# Patient Record
Sex: Male | Born: 1995 | Race: White | Hispanic: No | Marital: Single | Smoking: Current some day smoker
Health system: Southern US, Community
[De-identification: ages and names within clinical notes are randomized; demographics above are authoritative.]

## PROBLEM LIST (undated history)

## (undated) DIAGNOSIS — F419 Anxiety disorder, unspecified: Secondary | ICD-10-CM

## (undated) DIAGNOSIS — F909 Attention-deficit hyperactivity disorder, unspecified type: Secondary | ICD-10-CM

## (undated) DIAGNOSIS — K219 Gastro-esophageal reflux disease without esophagitis: Secondary | ICD-10-CM

## (undated) HISTORY — DX: Attention-deficit hyperactivity disorder, unspecified type: F90.9

---
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
## 2014-03-19 ENCOUNTER — Institutional Professional Consult (permissible substitution): Payer: No Typology Code available for payment source | Admitting: Pediatrics

## 2014-03-31 ENCOUNTER — Institutional Professional Consult (permissible substitution) (INDEPENDENT_AMBULATORY_CARE_PROVIDER_SITE_OTHER): Payer: No Typology Code available for payment source | Admitting: Pediatrics

## 2014-03-31 DIAGNOSIS — F909 Attention-deficit hyperactivity disorder, unspecified type: Secondary | ICD-10-CM

## 2014-05-07 ENCOUNTER — Encounter: Payer: Self-pay | Admitting: Family Medicine

## 2014-05-07 ENCOUNTER — Ambulatory Visit (INDEPENDENT_AMBULATORY_CARE_PROVIDER_SITE_OTHER): Payer: No Typology Code available for payment source | Admitting: Family Medicine

## 2014-05-07 ENCOUNTER — Telehealth: Payer: Self-pay | Admitting: Family Medicine

## 2014-05-07 VITALS — BP 127/81 | HR 94 | Temp 97.6°F | Ht 72.0 in | Wt 256.0 lb

## 2014-05-07 DIAGNOSIS — IMO0002 Reserved for concepts with insufficient information to code with codable children: Secondary | ICD-10-CM

## 2014-05-07 DIAGNOSIS — S39012A Strain of muscle, fascia and tendon of lower back, initial encounter: Secondary | ICD-10-CM

## 2014-05-07 MED ORDER — CYCLOBENZAPRINE HCL 5 MG PO TABS: 5.0000 mg | ORAL_TABLET | Freq: Three times a day (TID) | ORAL | Status: DC | PRN

## 2014-05-07 NOTE — Patient Instructions (Signed)
You are likely suffering from upper back strain and spasm Please start regular exercises and heat and massage.  Please try the flexeril at night when you aren't working or driving Please start taking advil 600-800 mg every 6-8 hours for the next 7-14 days Come back in 2 weeks if you aren't any better.  Mid-Back Strain with Rehab  A strain is an injury in which a tendon or muscle is torn. The muscles and tendons of the mid-back are vulnerable to strains. However, these muscles and tendons are very strong and require a great force to be injured. The muscles of the mid-back are responsible for stabilizing the spinal column, as well as spinal twisting (rotation). Strains are classified into three categories. Grade 1 strains cause pain, but the tendon is not lengthened. Grade 2 strains include a lengthened ligament, due to the ligament being stretched or partially ruptured. With grade 2 strains there is still function, although the function may be decreased. Grade 3 strains involve a complete tear of the tendon or muscle, and function is usually impaired. SYMPTOMS   Pain in the middle of the back.  Pain that may affect only one side, and is worse with movement.  Muscle spasms, and often swelling in the back.  Loss of strength of the back muscles.  Crackling sound (crepitation) when the muscles are touched. CAUSES  Mid-back strains occur when a force is placed on the muscles or tendons that is greater than they can handle. Common causes of injury include:  Ongoing overuse of the muscle-tendon units in the middle back, usually from incorrect body posture.  A single violent injury or force applied to the back. RISK INCREASES WITH:  Sports that involve twisting forces on the spine or a lot of bending at the waist (football, rugby, weightlifting, bowling, golf, tennis, speed skating, racquetball, swimming, running, gymnastics, diving).  Poor strength and flexibility.  Failure to warm up properly  before activity.  Family history of low back pain or disk disorders.  Previous back injury or surgery (especially fusion). PREVENTION  Learn and use proper sports technique.  Warm up and stretch properly before activity.  Allow for adequate recovery between workouts.  Maintain physical fitness:  Strength, flexibility, and endurance.  Cardiovascular fitness. PROGNOSIS  If treated properly, mid-back strains usually heal within 6 weeks. RELATED COMPLICATIONS   Frequently recurring symptoms, resulting in a chronic problem. Properly treating the problem the first time decreases frequency of recurrence.  Chronic inflammation, scarring, and partial muscle-tendon tear.  Delayed healing or resolution of symptoms, especially if activity is resumed too soon.  Prolonged disability. TREATMENT Treatment first involves the use of ice and medicine, to reduce pain and inflammation. As the pain begins to subside, you may begin strengthening and stretching exercises to improve body posture and sport technique. These exercises may be performed at home or with a therapist. Severe injuries may require referral to a therapist for further evaluation and treatment, such as ultrasound. Corticosteroid injections may be given to help reduce inflammation. Biofeedback (watching monitors of your body processes) and psychotherapy may also be prescribed. Prolonged bed rest is felt to do more harm than good. Massage may help break the muscle spasms. Sometimes, an injection of cortisone, with or without local anesthetics, may be given to help relieve the pain and spasms. MEDICATION   If pain medicine is needed, nonsteroidal anti-inflammatory medicines (aspirin and ibuprofen), or other minor pain relievers (acetaminophen), are often advised.  Do not take pain medicine for 7 days  before surgery.  Prescription pain relievers may be given, if your caregiver thinks they are needed. Use only as directed and only as much  as you need.  Ointments applied to the skin may be helpful.  Corticosteroid injections may be given by your caregiver. These injections should be reserved for the most serious cases, because they may only be given a certain number of times. HEAT AND COLD:   Cold treatment (icing) should be applied for 10 to 15 minutes every 2 to 3 hours for inflammation and pain, and immediately after activity that aggravates your symptoms. Use ice packs or an ice massage.  Heat treatment may be used before performing stretching and strengthening activities prescribed by your caregiver, physical therapist, or athletic trainer. Use a heat pack or a warm water soak. SEEK IMMEDIATE MEDICAL CARE IF:  Symptoms get worse or do not improve in 2 to 4 weeks, despite treatment.  You develop numbness, weakness, or loss of bowel or bladder function.  New, unexplained symptoms develop. (Drugs used in treatment may produce side effects.) EXERCISES RANGE OF MOTION (ROM) AND STRETCHING EXERCISES - Mid-Back Strain These exercises may help you when beginning to rehabilitate your injury. In order to successfully resolve your symptoms, you must improve your posture. These exercises are designed to help reduce the forward-head and rounded-shoulder posture which contributes to this condition. Your symptoms may resolve with or without further involvement from your physician, physical therapist or athletic trainer. While completing these exercises, remember:   Restoring tissue flexibility helps normal motion to return to the joints. This allows healthier, less painful movement and activity.  An effective stretch should be held for at least 30 seconds.  A stretch should never be painful. You should only feel a gentle lengthening or release in the stretched tissue. STRETCH - Axial Extension  Stand or sit on a firm surface. Assume a good posture: chest up, shoulders drawn back, stomach muscles slightly tense, knees unlocked (if  standing) and feet hip width apart.  Slowly retract your chin, so your head slides back and your chin slightly lowers. Continue to look straight ahead.  You should feel a gentle stretch in the back of your head. Be certain not to feel an aggressive stretch since this can cause headaches later.  Hold for __________ seconds. Repeat __________ times. Complete this exercise __________ times per day. RANGE OF MOTION- Upper Thoracic Extension  Sit on a firm chair with a high back. Assume a good posture: chest up, shoulders drawn back, abdominal muscles slightly tense, and feet hip width apart. Place a small pillow or folded towel in the curve of your lower back, if you are having difficulty maintaining good posture.  Gently brace your neck with your hands, allowing your arms to rest on your chest.  Continue to support your neck and slowly extend your back over the chair. You will feel a stretch across your upper back.  Hold __________ seconds. Slowly return to the starting position. Repeat __________ times. Complete this exercise __________ times per day. RANGE OF MOTION- Mid-Thoracic Extension  Roll a towel so that it is about 4 inches in diameter.  Position the towel lengthwise. Lay on the towel so that your spine, but not your shoulder blades, are supported.  You should feel your mid-back arching toward the floor. To increase the stretch, extend your arms away from your body.  Hold for __________ seconds. Repeat exercise __________ times, __________ times per day. STRENGTHENING EXERCISES - Mid-Back Strain These exercises may  help you when beginning to rehabilitate your injury. They may resolve your symptoms with or without further involvement from your physician, physical therapist or athletic trainer. While completing these exercises, remember:   Muscles can gain both the endurance and the strength needed for everyday activities through controlled exercises.  Complete these exercises  as instructed by your physician, physical therapist or athletic trainer. Increase the resistance and repetitions only as guided by your caregiver.  You may experience muscle soreness or fatigue, but the pain or discomfort you are trying to eliminate should never worsen during these exercises. If this pain does worsen, stop and make certain you are following the directions exactly. If the pain is still present after adjustments, discontinue the exercise until you can discuss the trouble with your caregiver. STRENGTHENING - Quadruped, Opposite UE/LE Lift  Assume a hands and knees position on a firm surface. Keep your hands under your shoulders and your knees under your hips. You may place padding under your knees for comfort.  Find your neutral spine and gently tense your abdominal muscles so that you can maintain this position. Your shoulders and hips should form a rectangle that is parallel with the floor and is not twisted.  Keeping your trunk steady, lift your right hand no higher than your shoulder and then your left leg no higher than your hip. Make sure you are not holding your breath. Hold this position __________ seconds.  Continuing to keep your abdominal muscles tense and your back steady, slowly return to your starting position. Repeat with the opposite arm and leg. Repeat __________ times. Complete this exercise __________ times per day.  STRENGTH - Shoulder Extensors  Secure a rubber exercise band or tubing to a fixed object (table, pole) so that it is at the height of your shoulders when you are either standing, or sitting on a firm armless chair.  With a thumbs-up grip, grasp an end of the band in each hand. Straighten your elbows and lift your hands straight in front of you at shoulder height. Step back away from the secured end of band, until it becomes tense.  Squeezing your shoulder blades together, pull your hands down to the sides of your thighs. Do not allow your hands to go  behind you.  Hold for __________ seconds. Slowly ease the tension on the band, as you reverse the directions and return to the starting position. Repeat __________ times. Complete this exercise __________ times per day.  STRENGTH - Horizontal Abductors Choose one of the two positions to complete this exercise. Prone: lying on stomach:  Lie on your stomach on a firm surface so that your right / left arm overhangs the edge. Rest your forehead on your opposite forearm. With your palm facing the floor and your elbow straight, hold a __________ weight in your hand.  Squeeze your right / left shoulder blade to your mid-back spine and then slowly raise your arm to the height of the bed.  Hold for __________ seconds. Slowly reverse the directions and return to the starting position, controlling the weight as you lower your arm. Repeat __________ times. Complete this exercise __________ times per day. Standing:   Secure a rubber exercise band or tubing, so that it is at the height of your shoulders when you are either standing, or sitting on a firm armless chair.  Grasp an end of the band in each hand and have your palms face each other. Straighten your elbows and lift your hands straight in front  of you at shoulder height. Step back away from the secured end of band, until it becomes tense.  Squeeze your shoulder blades together. Keeping your elbows locked and your hands at shoulder height, spread your arms apart, forming a "T" shape with your body. Hold __________ seconds. Slowly ease the tension on the band, as you reverse the directions and return to the starting position. Repeat __________ times. Complete this exercise __________ times per day. STRENGTH - Scapular Retractors and External Rotators, Rowing  Secure a rubber exercise band or tubing, so that it is at the height of your shoulders when you are either standing, or sitting on a firm armless chair.  With a palm-down grip, grasp an end of  the band in each hand. Straighten your elbows and lift your hands straight in front of you at shoulder height. Step back away from the secured end of band, until it becomes tense.  Step 1: Squeeze your shoulder blades together. Bending your elbows, draw your hands to your chest as if you are rowing a boat. At the end of this motion, your hands and elbow should be at shoulder height and your elbows should be out to your sides.  Step 2: Rotate your shoulder to raise your hands above your head. Your forearms should be vertical and your upper arms should be horizontal.  Hold for __________ seconds. Slowly ease the tension on the band, as you reverse the directions and return to the starting position. Repeat __________ times. Complete this exercise __________ times per day.  POSTURE AND BODY MECHANICS CONSIDERATIONS - Mid-Back Strain Keeping correct posture when sitting, standing or completing your activities will reduce the stress put on different body tissues, allowing injured tissues a chance to heal and limiting painful experiences. The following are general guidelines for improved posture. Your physician or physical therapist will provide you with any instructions specific to your needs. While reading these guidelines, remember:  The exercises prescribed by your provider will help you have the flexibility and strength to maintain correct postures.  The correct posture provides the best environment for your joints to work. All of your joints have less wear and tear when properly supported by a spine with good posture. This means you will experience a healthier, less painful body.  Correct posture must be practiced with all of your activities, especially prolonged sitting and standing. Correct posture is as important when doing repetitive low-stress activities (typing) as it is when doing a single heavy-load activity (lifting). PROPER SITTING POSTURE In order to minimize stress and discomfort on your  spine, you must sit with correct posture. Sitting with good posture should be effortless for a healthy body. Returning to good posture is a gradual process. Many people can work toward this most comfortably by using various supports until they have the flexibility and strength to maintain this posture on their own. When sitting with proper posture, your ears will fall over your shoulders and your shoulders will fall over your hips. You should use the back of the chair to support your upper back. Your lower back will be in a neutral position, just slightly arched. You may place a small pillow or folded towel at the base of your low back for  support.  When working at a desk, create an environment that supports good, upright posture. Without extra support, muscles fatigue and lead to excessive strain on joints and other tissues. Keep these recommendations in mind: CHAIR:  A chair should be able to slide under  your desk when your back makes contact with the back of the chair. This allows you to work closely.  The chair's height should allow your eyes to be level with the upper part of your monitor and your hands to be slightly lower than your elbows. BODY POSITION  Your feet should make contact with the floor. If this is not possible, use a foot rest.  Keep your ears over your shoulders. This will reduce stress on your neck and lower back. INCORRECT SITTING POSTURES If you are feeling tired and unable to assume a healthy sitting posture, do not slouch or slump. This puts excessive strain on your back tissues, causing more damage and pain. Healthier options include:  Using more support, like a lumbar pillow.  Switching tasks to something that requires you to be upright or walking.  Talking a brief walk.  Lying down to rest in a neutral-spine position. CORRECT STANDING POSTURES Proper standing posture should be assumed with all daily activities, even if they only take a few moments, like when  brushing your teeth. As in sitting, your ears should fall over your shoulders and your shoulders should fall over your hips. You should keep a slight tension in your abdominal muscles to brace your spine. Your tailbone should point down to the ground, not behind your body, resulting in an over-extended swayback posture.  INCORRECT STANDING POSTURES Common incorrect standing postures include a forward head, locked knees, and an excessive swayback. WALKING Walk with an upright posture. Your ears, shoulders and hips should all line-up. CORRECT LIFTING TECHNIQUES DO :   Assume a wide stance. This will provide you more stability and the opportunity to get as close as possible to the object which you are lifting.  Tense your abdominals to brace your spine. Bend at the knees and hips. Keeping your back locked in a neutral-spine position, lift using your leg muscles. Lift with your legs, keeping your back straight.  Test the weight of unknown objects before attempting to lift them.  Try to keep your elbows locked down at your sides in order get the best strength from your shoulders when carrying an object.  Always ask for help when lifting heavy or awkward objects. INCORRECT LIFTING TECHNIQUES DO NOT:   Lock your knees when lifting, even if it is a small object.  Bend and twist. Pivot at your feet or move your feet when needing to change directions.  Assume that you can safely pick up even a paperclip without proper posture. Document Released: 08/28/2005 Document Revised: 01/12/2014 Document Reviewed: 12/10/2008 [facility] Patient Information 2015 [place], [place]. This information is not intended to replace advice given to you by your health care provider. Make sure you discuss any questions you have with your health care provider.

## 2014-05-07 NOTE — Progress Notes (Signed)
Thomas Wolfe is a 18 y.o. male who presents to [facility] today for back pain  Upper back pain: ongoing for 2 wks. Works for [employer] which requires a lot of upper back work. Worse over the course of the day. Advil 200 w/o benefit. No relief w/ rest. [employer]arey Seat off riding lawnmower and hit back on trailer floor 5 weeks ago. Denies nubmness or weakness in UE. Started full time at job this summer.   The following portions of the patient's history were reviewed and updated as appropriate: allergies, current medications, past medical history, family and social history, and problem list.  Patient is a nonsmoker.  History reviewed. No pertinent past medical history.  ROS as above otherwise neg.    Medications reviewed. Current Outpatient Prescriptions  Medication Sig Dispense Refill  . lisdexamfetamine (VYVANSE) 70 MG capsule Take 70 mg by mouth daily.       No current facility-administered medications for this visit.    Exam:  BP 127/81  Pulse 94  Temp(Src) 97.6 F (36.4 C) (Oral)  Ht 6' (1.829 m)  Wt 256 lb (116.121 kg)  BMI 34.71 kg/m2 Gen: Well NAD HEENT: EOMI,  MMM Lungs: CTABL Nl WOB Heart: RRR no MRG Abd: NABS, NT, ND Exts: Non edematous BL  LE, warm and well perfused.  MSK: back FROM. Mild perispinal thoracic muscle ttp L>R. UE strength w/ flexion, extension, abduction, adduction grip strength 5/5 bilat.   No results found for this or any previous visit (from the past 72 hour(s)).  A/P (as seen in Problem list)  No problem-specific assessment & plan notes found for this encounter.  Back pain likely from muscle strain/spasm - start high dose antiinflammatory such as Advil 600-800 - daily exercises - heat and massage - flexeril (QHS) F/u in 2 wks if not better Precautions given and all questions answered

## 2014-05-21 NOTE — Telephone Encounter (Signed)
No return call from patient.

## 2014-07-09 ENCOUNTER — Institutional Professional Consult (permissible substitution): Payer: Self-pay | Admitting: Pediatrics

## 2014-07-09 DIAGNOSIS — F8181 Disorder of written expression: Secondary | ICD-10-CM

## 2014-07-09 DIAGNOSIS — F902 Attention-deficit hyperactivity disorder, combined type: Secondary | ICD-10-CM

## 2014-10-07 ENCOUNTER — Institutional Professional Consult (permissible substitution) (INDEPENDENT_AMBULATORY_CARE_PROVIDER_SITE_OTHER): Payer: 59 | Admitting: Pediatrics

## 2014-10-07 DIAGNOSIS — F70 Mild intellectual disabilities: Secondary | ICD-10-CM

## 2014-10-07 DIAGNOSIS — F902 Attention-deficit hyperactivity disorder, combined type: Secondary | ICD-10-CM

## 2015-01-06 ENCOUNTER — Institutional Professional Consult (permissible substitution): Payer: 59 | Admitting: Pediatrics

## 2015-01-07 ENCOUNTER — Institutional Professional Consult (permissible substitution): Payer: 59 | Admitting: Pediatrics

## 2015-01-07 DIAGNOSIS — F902 Attention-deficit hyperactivity disorder, combined type: Secondary | ICD-10-CM | POA: Diagnosis not present

## 2015-01-07 DIAGNOSIS — F79 Unspecified intellectual disabilities: Secondary | ICD-10-CM | POA: Diagnosis not present

## 2015-05-05 ENCOUNTER — Institutional Professional Consult (permissible substitution) (INDEPENDENT_AMBULATORY_CARE_PROVIDER_SITE_OTHER): Payer: 59 | Admitting: Pediatrics

## 2015-05-05 DIAGNOSIS — E6609 Other obesity due to excess calories: Secondary | ICD-10-CM | POA: Diagnosis not present

## 2015-05-05 DIAGNOSIS — F902 Attention-deficit hyperactivity disorder, combined type: Secondary | ICD-10-CM | POA: Diagnosis not present

## 2015-05-05 DIAGNOSIS — F8081 Childhood onset fluency disorder: Secondary | ICD-10-CM | POA: Diagnosis not present

## 2015-10-07 ENCOUNTER — Encounter: Payer: Self-pay | Admitting: Family Medicine

## 2015-10-07 ENCOUNTER — Ambulatory Visit (INDEPENDENT_AMBULATORY_CARE_PROVIDER_SITE_OTHER): Payer: BLUE CROSS/BLUE SHIELD | Admitting: Family Medicine

## 2015-10-07 VITALS — BP 140/80 | HR 95 | Temp 97.4°F | Ht 73.0 in | Wt 285.4 lb

## 2015-10-07 DIAGNOSIS — F909 Attention-deficit hyperactivity disorder, unspecified type: Secondary | ICD-10-CM | POA: Diagnosis not present

## 2015-10-07 MED ORDER — MOMETASONE FUROATE 50 MCG/ACT NA SUSP: NASAL | Status: DC

## 2015-10-07 NOTE — Progress Notes (Signed)
   HPI  Patient presents today to establish care.   Explains he has ADHD and doesn't want to travel to [place] anymore to get refills. He states that he's been on ADHD medications for several years and had to stop because of insurance coverage. His last doctor was a psychiatrist.  He denies any problems today except for nasal congestion and ear fullness on the right. He denies any muffled hearing or ear pain.  He works in [employer]  PMH: Smoking status noted Past medical, surgical, social, family history reviewed and updated in EMR ROS: Per HPI  Objective: BP 140/80 mmHg  Pulse 95  Temp(Src) 97.4 F (36.3 C) (Oral)  Ht  (1.854 m)  Wt 285 lb 6.4 oz (129.457 kg)  BMI 37.66 kg/m2 Gen: NAD, alert, cooperative with exam HEENT: NCAT, TMs normal bilaterally, nares with swollen turbinates left greater than right, oropharynx clear CV: RRR, good S1/S2, no murmur Resp: CTABL, no wheezes, non-labored Abd: SNTND, BS present, no guarding or organomegaly Ext: No edema, warm Neuro: Alert and oriented, No gross deficits, prominent stutter   Assessment and plan:  # ADHD Requested records from his psychiatrist, we'll gladly refill if he has reasonable diagnostic criteria from there. Follow-up one month after first prescription, plan to fill prescription after records are received  Postnasal drip, allergic rhinitis Nasonex  Murtis Sink, MD [place] [facility] 10/07/2015, 3:37 PM

## 2015-10-07 NOTE — Patient Instructions (Signed)
Great to meet you!  We will review your records when they come.   Lets see you back in 1 month

## 2015-10-11 ENCOUNTER — Telehealth: Payer: Self-pay | Admitting: Family Medicine

## 2015-10-11 MED ORDER — AMPHETAMINE-DEXTROAMPHET ER 30 MG PO CP24: 30.0000 mg | ORAL_CAPSULE | Freq: Every day | ORAL | Status: DC

## 2015-10-11 NOTE — Telephone Encounter (Signed)
I received his psychiatry records and reviewed them. He was recently treated for ADHD in August 2016 with Adderall XR 30 mg.  I will go ahead and resume Adderall XR 30 mg and asked him to follow-up in one month prior to next refill.   Prescription printed, will ask nursing to notify patient and request one month follow-up  Murtis Sink, MD [facility] 10/11/2015, 1:33 PM

## 2015-10-11 NOTE — Telephone Encounter (Signed)
Left detailed message per dpr  

## 2016-04-27 ENCOUNTER — Encounter: Payer: Self-pay | Admitting: Family Medicine

## 2016-04-27 ENCOUNTER — Ambulatory Visit (INDEPENDENT_AMBULATORY_CARE_PROVIDER_SITE_OTHER): Payer: BLUE CROSS/BLUE SHIELD | Admitting: Family Medicine

## 2016-04-27 DIAGNOSIS — F902 Attention-deficit hyperactivity disorder, combined type: Secondary | ICD-10-CM | POA: Diagnosis not present

## 2016-04-27 DIAGNOSIS — F909 Attention-deficit hyperactivity disorder, unspecified type: Secondary | ICD-10-CM | POA: Insufficient documentation

## 2016-04-27 MED ORDER — CITALOPRAM HYDROBROMIDE 20 MG PO TABS: 20.0000 mg | ORAL_TABLET | Freq: Every day | ORAL | 0 refills | Status: DC

## 2016-04-27 MED ORDER — AMPHETAMINE-DEXTROAMPHET ER 10 MG PO CP24: 10.0000 mg | ORAL_CAPSULE | Freq: Every day | ORAL | 0 refills | Status: DC

## 2016-04-27 MED ORDER — ADDERALL 10 MG PO TABS: 10.0000 mg | ORAL_TABLET | Freq: Every day | ORAL | 0 refills | Status: DC

## 2016-04-27 MED ORDER — AMPHETAMINE-DEXTROAMPHET ER 30 MG PO CP24: 30.0000 mg | ORAL_CAPSULE | Freq: Every day | ORAL | 0 refills | Status: DC

## 2016-04-27 NOTE — Progress Notes (Signed)
   Subjective:    Patient ID: Thomas Wolfe, male    DOB: 12/22/1995, 20 y.o.   MRN: [id number]  HPI patient has history of ADHD. Also mentions anger management and thinks he is taking medicine for ADHD which controls anger. He has been on multiple medicines but feels like they are not as effective as a records were medicines include Adderall, Vyvanse, and  There are no active problems to display for this patient.  Outpatient Encounter Prescriptions as of 04/27/2016  Medication Sig  . amphetamine-dextroamphetamine (ADDERALL XR) 30 MG 24 hr capsule Take 1 capsule (30 mg total) by mouth daily. (Patient not taking: Reported on 04/27/2016)  . mometasone (NASONEX) 50 MCG/ACT nasal spray 2 sprays per nostril daily (Patient not taking: Reported on 04/27/2016)   No facility-administered encounter medications on file as of 04/27/2016.      Review of Systems  Constitutional: Negative.   HENT: Negative.   Respiratory: Negative.   Cardiovascular: Negative.   Neurological: Negative.   Psychiatric/Behavioral: Negative.        Objective:   Physical Exam  Constitutional: He is oriented to person, place, and time. He appears well-developed and well-nourished.  Cardiovascular: Normal rate, regular rhythm and normal heart sounds.   Pulmonary/Chest: Effort normal and breath sounds normal.  Neurological: He is alert and oriented to person, place, and time.  Psychiatric: He has a normal mood and affect. His behavior is normal.   BP 131/77 (BP Location: Right Arm, Patient Position: Sitting, Cuff Size: Large)   Pulse 86   Temp 97.4 F (36.3 C) (Oral)   Ht 6\' 1"  (1.854 m)   Wt 289 lb (131.1 kg)   BMI 38.13 kg/m         Assessment & Plan:  1. Attention deficit hyperactivity disorder (ADHD), combined type We'll try to increase dose of Adderall XR from 30-40 mg. Also add Celexa for 1 month to see if that might help with anger management.  Frederica KusterStephen M Miller MD

## 2016-06-14 ENCOUNTER — Other Ambulatory Visit: Payer: Self-pay | Admitting: Family Medicine

## 2016-07-07 ENCOUNTER — Ambulatory Visit (INDEPENDENT_AMBULATORY_CARE_PROVIDER_SITE_OTHER): Payer: BLUE CROSS/BLUE SHIELD | Admitting: Family Medicine

## 2016-07-07 ENCOUNTER — Encounter: Payer: Self-pay | Admitting: Family Medicine

## 2016-07-07 ENCOUNTER — Telehealth: Payer: Self-pay | Admitting: Family Medicine

## 2016-07-07 VITALS — BP 128/87 | HR 102 | Temp 98.1°F | Ht 73.0 in | Wt 294.5 lb

## 2016-07-07 DIAGNOSIS — F902 Attention-deficit hyperactivity disorder, combined type: Secondary | ICD-10-CM | POA: Diagnosis not present

## 2016-07-07 MED ORDER — AMPHETAMINE-DEXTROAMPHET ER 20 MG PO CP24: 20.0000 mg | ORAL_CAPSULE | Freq: Every day | ORAL | 0 refills | Status: DC

## 2016-07-07 NOTE — Patient Instructions (Signed)
Continue current medications. Continue good therapeutic lifestyle changes which include good diet and exercise. Fall precautions discussed with patient. If an FOBT was given today- please return it to our front desk. If you are over 21 years old - you may need Prevnar 13 or the adult Pneumonia vaccine.   After your visit with us today you will receive a survey in the mail or online from [employer] regarding your care with us. Please take a moment to fill this out. Your feedback is very important to us as you can help us better understand your patient needs as well as improve your experience and satisfaction. WE CARE ABOUT YOU!!!    

## 2016-07-07 NOTE — Telephone Encounter (Signed)
Mother is concerned, patient is not taking medications, Celexa or Adderall.  He is having anger outbursts and mood swings.  Mom reports he is constantly "trading" things with friends - such as a new truck for 2 four wheelers - and other things.  She is very concerned about his behavior and how to help him.  Wanted to make you aware of this before his appointment today.

## 2016-07-07 NOTE — Progress Notes (Signed)
   Subjective:    Patient ID: Thomas Wolfe, male    DOB: 09/26/1995, 20 y.o.   MRN: [id number]  HPI  Patient is here today for follow up of his medications.  He reports he has not been taking Adderall or Celexa. Patient's mom had called earlier to say he is not taking any of his medicines. He says it causes him to "zone out.". He thinks it is due to the higher dose, 40 mg. He says he is willing to try a lower dose.  Patient Active Problem List   Diagnosis Date Noted  . Attention deficit hyperactivity disorder (ADHD) 04/27/2016   Outpatient Encounter Prescriptions as of 07/07/2016  Medication Sig  . amphetamine-dextroamphetamine (ADDERALL XR) 10 MG 24 hr capsule Take 1 capsule (10 mg total) by mouth daily. (Patient not taking: Reported on 07/07/2016)  . amphetamine-dextroamphetamine (ADDERALL XR) 30 MG 24 hr capsule Take 1 capsule (30 mg total) by mouth daily. (Patient not taking: Reported on 07/07/2016)  . citalopram (CELEXA) 20 MG tablet TAKE 1 TABLET (20 MG TOTAL) BY MOUTH DAILY. (Patient not taking: Reported on 07/07/2016)  . mometasone (NASONEX) 50 MCG/ACT nasal spray 2 sprays per nostril daily (Patient not taking: Reported on 07/07/2016)   No facility-administered encounter medications on file as of 07/07/2016.        Review of Systems  Constitutional: Negative.   HENT: Negative.   Eyes: Negative.   Respiratory: Negative.   Cardiovascular: Negative.   Gastrointestinal: Negative.   Endocrine: Negative.   Genitourinary: Negative.   Musculoskeletal: Negative.   Skin: Negative.   Allergic/Immunologic: Negative.   Neurological: Negative.   Hematological: Negative.   Psychiatric/Behavioral: Negative.             Objective:   Physical Exam  Constitutional: He is oriented to person, place, and time. He appears well-developed and well-nourished.  Patient has a mild to moderate stuttering defect  Cardiovascular: Regular rhythm.   Pulmonary/Chest: Effort normal and  breath sounds normal.  Neurological: He is alert and oriented to person, place, and time.  Psychiatric: He has a normal mood and affect. His behavior is normal.     BP 128/87   Pulse (!) 102   Temp 98.1 F (36.7 C) (Oral)   Ht 6\' 1"  (1.854 m)   Wt 294 lb 8 oz (133.6 kg)   BMI 38.85 kg/m          Assessment & Plan:  1. Attention deficit hyperactivity disorder (ADHD), combined type We will retry Adderall XR at a half dose he was taking previously that is 20 mg. He has agreed to take this. I have asked him to come back in 3 weeks before his prescription expires to assess how he is doing. We also executed a drug contract with him today.  Frederica KusterStephen M Kymora Sciara MD

## 2016-07-26 ENCOUNTER — Ambulatory Visit (INDEPENDENT_AMBULATORY_CARE_PROVIDER_SITE_OTHER): Payer: BLUE CROSS/BLUE SHIELD | Admitting: Family Medicine

## 2016-07-26 ENCOUNTER — Encounter: Payer: Self-pay | Admitting: Family Medicine

## 2016-07-26 VITALS — BP 128/80 | HR 110 | Temp 97.4°F | Ht 73.0 in | Wt 294.0 lb

## 2016-07-26 DIAGNOSIS — F902 Attention-deficit hyperactivity disorder, combined type: Secondary | ICD-10-CM

## 2016-07-26 MED ORDER — AMPHETAMINE-DEXTROAMPHET ER 20 MG PO CP24: 20.0000 mg | ORAL_CAPSULE | ORAL | 0 refills | Status: DC

## 2016-07-26 MED ORDER — CITALOPRAM HYDROBROMIDE 20 MG PO TABS: 20.0000 mg | ORAL_TABLET | Freq: Every day | ORAL | 2 refills | Status: DC

## 2016-07-26 MED ORDER — AMPHETAMINE-DEXTROAMPHET ER 20 MG PO CP24: 20.0000 mg | ORAL_CAPSULE | Freq: Every day | ORAL | 0 refills | Status: DC

## 2016-07-26 NOTE — Progress Notes (Signed)
   Subjective:    Patient ID: Thomas Wolfe, male    DOB: 01/26/1996, 20 y.o.   MRN: [id number]  HPI 20 year old with ADHD. At his last visit 1 month ago we had reduced the dose of his Adderall from 40-20 mg. He had not been taking the medicine because it made him feel weird but now he says he has been taking the medicine and doing better. I had received no calls from mom to the contrary.  Patient Active Problem List   Diagnosis Date Noted  . Attention deficit hyperactivity disorder (ADHD) 04/27/2016   Outpatient Encounter Prescriptions as of 07/26/2016  Medication Sig  . amphetamine-dextroamphetamine (ADDERALL XR) 20 MG 24 hr capsule Take 1 capsule (20 mg total) by mouth daily.  . citalopram (CELEXA) 20 MG tablet TAKE 1 TABLET (20 MG TOTAL) BY MOUTH DAILY.  . mometasone (NASONEX) 50 MCG/ACT nasal spray 2 sprays per nostril daily   No facility-administered encounter medications on file as of 07/26/2016.       Review of Systems  Constitutional: Negative.   HENT: Negative.   Eyes: Negative.   Respiratory: Negative.  Negative for shortness of breath.   Cardiovascular: Negative.  Negative for chest pain and leg swelling.  Gastrointestinal: Negative.   Genitourinary: Negative.   Musculoskeletal: Negative.   Skin: Negative.   Neurological: Negative.   Psychiatric/Behavioral: Negative.   All other systems reviewed and are negative.      Objective:   Physical Exam  Constitutional: He is oriented to person, place, and time. He appears well-developed and well-nourished.  Cardiovascular: Normal rate and regular rhythm.   Pulmonary/Chest: Effort normal.  Neurological: He is alert and oriented to person, place, and time.  Psychiatric: He has a normal mood and affect. His behavior is normal.   BP 128/80   Pulse (!) 110   Temp 97.4 F (36.3 C) (Oral)   Ht 6\' 1"  (1.854 m)   Wt 294 lb (133.4 kg)   BMI 38.79 kg/m         Assessment & Plan:  1. Attention deficit  hyperactivity disorder (ADHD), combined type Refill Adderall for 3 months. Also restart citalopram at 10 mg. This is for mood stabilization and temper management.  Thomas KusterStephen M Miller MD

## 2016-09-01 ENCOUNTER — Other Ambulatory Visit: Payer: Self-pay | Admitting: Family Medicine

## 2016-10-26 ENCOUNTER — Ambulatory Visit: Payer: BLUE CROSS/BLUE SHIELD | Admitting: Family Medicine

## 2016-10-26 NOTE — Progress Notes (Deleted)
   Subjective:    Patient ID: Thomas Wolfe, male    DOB: 05/09/1996, 21 y.o.   MRN: [id number]  HPI    Review of Systems     Objective:   Physical Exam        Assessment & Plan:

## 2016-10-27 ENCOUNTER — Encounter: Payer: Self-pay | Admitting: Family Medicine

## 2017-02-07 ENCOUNTER — Ambulatory Visit: Payer: BLUE CROSS/BLUE SHIELD | Admitting: Pediatrics

## 2017-02-08 ENCOUNTER — Telehealth: Payer: Self-pay | Admitting: Family Medicine

## 2017-02-08 ENCOUNTER — Encounter: Payer: Self-pay | Admitting: Family Medicine

## 2017-07-06 NOTE — Progress Notes (Addendum)
Subjective: Thomas Wolfe follow up PCP: Thomas Ip, DO BJY:NWGNFAOZ Thomas Wolfe is a 21 y.o. male presenting to clinic today for:  1.  ADHD Patient was last seen in November 2017 for ADHD.  He was prescribed 3 months of Adderall 20 mg and instructed to follow-up at 3 months.  In the past, he has been prescribed as much as 40 mg of Adderall daily, but he noted that he was feeling "weird" on the 40 mg tablets and therefore cut these in half.  Report of symptoms: Increased stuttering.  Patient notes that in the past he had poor concentration.  He does not feel that concentration is significantly impaired but his parents do note that he seems to be more focused and has less stuttering when he is on ADHD medications.  He reports that he has been on Adderall, Concerta, Metadate and Vyvanse in the past.  He notes that Vyvanse was most helpful but that he maxed out at 70 mg and developed a tolerance to the medication.  He has not been on Vyvanse for several years now.  He notes that the Adderall even at 20 mg has made him feel "zoned out" and he wishes not to continue this medication.  He notes that he is able to perform his work duties without difficulty.  Denies constipation, dry mouth, heart palpitations, headache or visual disturbance while on medication.  He works at [employer] on night shift.  Age of onset / duration of symptoms: School age  Has the patient ever repeated any grades? yes  No drug or ETOH use.  No Known Allergies Past Medical History:  Diagnosis Date  . ADHD (attention deficit hyperactivity disorder)    Family History  Problem Relation Age of Onset  . Diabetes Mother    No current outpatient prescriptions on file.  Social Hx: non smoker.  Health Maintenance: Flu shot  ROS: Per HPI  Objective: Office vital signs reviewed. BP 117/81   Pulse 99   Temp (!) 97.4 F (36.3 C) (Oral)   Ht 6\' 1"  (1.854 m)   Wt 258 lb 9.6 oz (117.3 kg)   BMI 34.12 kg/m   Physical  Examination:  General: Awake, alert, well nourished, No acute distress HEENT: Normal    Eyes: PERRLA, extraocular membranes intact, sclera white    Throat: moist mucus membranes Cardio: regular rate and rhythm, S1S2 heard, no murmurs appreciated Pulm: clear to auscultation bilaterally, no wheezes, rhonchi or rales; normal work of breathing on room air GI: soft, non-tender, non-distended, bowel sounds present x4, no hepatomegaly, no splenomegaly, no masses Psych: Mood stable, speech stuttered, affect appropriate, I question possible cognitive delay in this patient as he is somewhat childlike for age.  Depression screen [facility] 2/9 07/09/2017 07/26/2016 07/07/2016  Decreased Interest 0 - 0  Down, Depressed, Hopeless 0 0 0  PHQ - 2 Score 0 0 0  Altered sleeping - 0 -  Tired, decreased energy - 1 -  Change in appetite - 1 -  Feeling bad or failure about yourself  - 0 -  Trouble concentrating - 1 -  Moving slowly or fidgety/restless - 0 -  Suicidal thoughts - 0 -  PHQ-9 Score - 3 -   Assessment/ Plan: 21 y.o. male   1. Attention deficit hyperactivity disorder (ADHD), combined type His concentration does not appear to be that impaired, we did discuss that stuttered speech may be better served by seeing a speech therapist versus cognitive behavioral therapist.  However, he does  note that he has had improvement in concentration and stuttering with ADHD medications and for this reason, we will revisit Vyvanse which has worked well for him in the past.  We will start at 30 mg dose which is the indicated dose for ADHD.  Controlled substance contract was signed and reviewed with patient today.  A urine drug screen was also obtained.  Patient will follow-up in 1 month for reassessment.  The [place] Narcotic Database has been reviewed.  There were no red flags.     2. Controlled substance contract signed - [employer] 13 (MW), Urine   Orders Placed This Encounter  Procedures  . [employer]  13 (MW), Urine   No orders of the defined types were placed in this encounter.    Thomas IpAshly M Gottschalk, DO [facility] [phone]

## 2017-07-09 ENCOUNTER — Ambulatory Visit (INDEPENDENT_AMBULATORY_CARE_PROVIDER_SITE_OTHER): Payer: BLUE CROSS/BLUE SHIELD | Admitting: Family Medicine

## 2017-07-09 ENCOUNTER — Encounter: Payer: Self-pay | Admitting: Family Medicine

## 2017-07-09 VITALS — BP 117/81 | HR 99 | Temp 97.4°F | Ht 73.0 in | Wt 258.6 lb

## 2017-07-09 DIAGNOSIS — F902 Attention-deficit hyperactivity disorder, combined type: Secondary | ICD-10-CM

## 2017-07-09 DIAGNOSIS — Z0289 Encounter for other administrative examinations: Secondary | ICD-10-CM

## 2017-07-09 MED ORDER — LISDEXAMFETAMINE DIMESYLATE 30 MG PO CAPS: 30.0000 mg | ORAL_CAPSULE | Freq: Every day | ORAL | 0 refills | Status: DC

## 2017-07-09 NOTE — Patient Instructions (Addendum)
We discussed different options for ADHD medication today.  It appears that you have been on basically all of them at least once in her lifetime.  Since Vyvanse worked well for you in the past, and you have been off of this medication for some time we will revisit this medication at the starting dose.  I think that it has been a long enough time that you should no longer be desensitized to this medication.  Plan to see me again in 1 month for ADHD follow up.  Controlled Substance Guidelines:  1. You cannot get an early refill, even it is lost.  2. You cannot get ADHD medications from any other doctor 3. You cannot use alcohol, marijuana, cocaine or any other recreational drugs while using this medication. This is very dangerous.  4. You are willing to have your urine drug tested at each visit.  5. If any medication is stolen, then there must be a police report to verify it, or it cannot be refilled.  6. I will not prescribe these medications for longer than 3 months.  7. You must bring your pill bottle to each visit.  8. You must use the same pharmacy for all refills for the medication, unless you clear it with me beforehand.  9. You cannot share or sell this medication.  10. Always take a stool softener to prevent constipation.

## 2017-07-13 LAB — TOXASSURE SELECT 13 (MW), URINE

## 2017-08-10 ENCOUNTER — Ambulatory Visit: Payer: BLUE CROSS/BLUE SHIELD | Admitting: Family Medicine

## 2017-08-14 ENCOUNTER — Encounter: Payer: Self-pay | Admitting: Family Medicine

## 2018-04-09 ENCOUNTER — Other Ambulatory Visit: Payer: Self-pay

## 2018-04-09 ENCOUNTER — Encounter: Payer: Self-pay | Admitting: Physical Therapy

## 2018-04-09 ENCOUNTER — Ambulatory Visit: Payer: 59 | Attending: Sports Medicine | Admitting: Physical Therapy

## 2018-04-09 DIAGNOSIS — R293 Abnormal posture: Secondary | ICD-10-CM

## 2018-04-09 DIAGNOSIS — M546 Pain in thoracic spine: Secondary | ICD-10-CM

## 2018-04-09 NOTE — Therapy (Signed)
[facility] [address] [place], [place], [address] Phone: [phone]   Fax:  [phone]  Physical Therapy Evaluation  Patient Details  Name: Thomas Wolfe MRN: [id number] Date of Birth: 04/25/1996 Referring Provider: Rodolph BongAdam Kendall MD.   Encounter Date: 04/09/2018  PT End of Session - 04/09/18 1836    Visit Number  1    Number of Visits  12    Date for PT Re-Evaluation  05/07/18    PT Start Time  0318    PT Stop Time  0412    PT Time Calculation (min)  54 min       Past Medical History:  Diagnosis Date  . ADHD (attention deficit hyperactivity disorder)     History reviewed. No pertinent surgical history.  There were no vitals filed for this visit.   Subjective Assessment - 04/09/18 1853    Subjective  The patient reports that in march of this year after beginning a new job he began to experience mid back pain and left chest pain and on occasions right chest pain.  He also reports some right sided neck pain.  He states his job require sheavy lifting and pushing.  His pain is moderate today but can rise to much higher levels with the aforementioned activities.  rest decreases pain.    Patient Stated Goals  Get out of pain.    Currently in Pain?  Yes    Pain Score  4     Pain Location  Back    Pain Orientation  Right;Left;Mid    Pain Descriptors / Indicators  Aching    Pain Type  Acute pain    Pain Onset  More than a month ago    Pain Frequency  Constant    Aggravating Factors   See above.    Pain Relieving Factors  See above.    Multiple Pain Sites  Yes    Pain Score  4    Pain Location  Chest    Pain Orientation  Left    Pain Descriptors / Indicators  Aching;Sharp    Pain Type  Acute pain    Pain Onset  More than a month ago    Pain Frequency  Constant    Aggravating Factors   See above.    Pain Relieving Factors  See above.         [facility] Assessment - 04/09/18 0001      Assessment   Medical Diagnosis   Thoracic/left pectoral pain.    Referring Provider  Rodolph BongAdam Kendall MD.    Onset Date/Surgical Date  -- 11/2017.      Precautions   Precautions  None      Restrictions   Weight Bearing Restrictions  No      Balance Screen   Has the patient fallen in the past 6 months  No    Has the patient had a decrease in activity level because of a fear of falling?   No    Is the patient reluctant to leave their home because of a fear of falling?   No      Home Environment   Living Environment  Private residence      Prior Function   Level of Independence  Independent      Posture/Postural Control   Posture/Postural Control  Postural limitations    Postural Limitations  Rounded Shoulders;Forward head    Posture Comments  Minimal thoracic kyphosis.      ROM /  Strength   AROM / PROM / Strength  AROM;Strength      AROM   Overall AROM Comments  Bilateral shoulder AROM is full.  Bilateral active cervical rotation= 60/65 degrees.      Strength   Overall Strength Comments  Thomas bilateral shoulder and scapular strength.  Some pain reproduction with resisted left shoulder adduction.      Palpation   Palpation comment  Tender to palpation in musculature around T5-T7.  Pain in right mid-cervical region.  Tender to palpation near humeral attachment of left pect major with increased tone when contralaterally compared.  Also c/o pain near upper portion of sternal attachment of left pect major.Marland Kitchen      Special Tests   Other special tests  Diminished bilateral Bicep reflexes; Brachioradialis and Tricep relfexes are Thomas.      Ambulation/Gait   Gait Comments  WNL.                Objective measurements completed on examination: See above findings.      [facility] Adult PT Treatment/Exercise - 04/09/18 0001      Modalities   Modalities  Electrical Stimulation;Moist Heat      Moist Heat Therapy   Number Minutes Moist Heat  20 Minutes    Moist Heat Location  -- Thoracic.      Haematologist.    Electrical Stimulation Action  IFC    Electrical Stimulation Parameters  80-150 Hz x 20 minutes.    Electrical Stimulation Goals  Pain                  PT Long Term Goals - 04/09/18 1913      PT LONG TERM GOAL #1   Title  Independent with an HEP.    Time  4    Period  Weeks    Status  New      PT LONG TERM GOAL #2   Title  perform ADL's and work activities with pain not > 2/10.    Time  4    Period  Weeks    Status  New             Plan - 04/09/18 1907    Clinical Impression Statement  The patient presents to OPPT with c/o mid-back and left pect major pain.  His job require heavy lifting and pushing with increases his pain.  Additionally, he has pain in his right C-spine.  patient expected to to well with skilled physical therpy intervetion to include thoracic mobs, postural exercises and STW/M to reduce muscle pain and tone.    Clinical Presentation  Evolving    Clinical Presentation due to:  Not improving.    Clinical Decision Making  Low    Rehab Potential  Excellent    PT Frequency  3x / week    PT Duration  4 weeks    PT Treatment/Interventions  ADLs/Self Care Home Management;Cryotherapy;Electrical Stimulation;Ultrasound;Therapeutic activities;Therapeutic exercise;Patient/family education;Manual techniques;Dry needling    PT Next Visit Plan  IASTM to left pect major; mid-thoracic mobs; chin tuck and cervical extension; corner stretch and scapular retratction with resistance; U/S, HMP and e'stim.  Foam roller to thoracic spine.    Consulted and Agree with Plan of Care  Patient       Patient will benefit from skilled therapeutic intervention in order to improve the following deficits and impairments:  Decreased activity tolerance, Increased muscle spasms, Postural dysfunction, Pain  Visit  Diagnosis: Pain in thoracic spine - Plan: PT plan of care cert/re-cert  Abnormal posture - Plan: PT plan  of care cert/re-cert     Problem List Patient Active Problem List   Diagnosis Date Noted  . Attention deficit hyperactivity disorder (ADHD) 04/27/2016    Thomas Wolfe, Thomas Wolfe  MPT 04/09/2018, 7:17 PM  [facility] [address] [place], [place], [address] Phone: [phone]   Fax:  [phone]  Name: Thomas Wolfe MRN: [id number] Date of Birth: [birth year]

## 2018-04-11 ENCOUNTER — Ambulatory Visit: Payer: 59 | Attending: Sports Medicine | Admitting: Physical Therapy

## 2018-04-11 ENCOUNTER — Encounter: Payer: Self-pay | Admitting: Physical Therapy

## 2018-04-11 DIAGNOSIS — R293 Abnormal posture: Secondary | ICD-10-CM

## 2018-04-11 DIAGNOSIS — M546 Pain in thoracic spine: Secondary | ICD-10-CM | POA: Insufficient documentation

## 2018-04-11 NOTE — Therapy (Signed)
[facility] [address] [place], [place], [address] Phone: [phone]   Fax:  [phone]  Physical Therapy Treatment  Patient Details  Name: Thomas Wolfe D [place]en[place] MRN: [id number] Date of Birth: 09/24/1995 Referring Provider: Rodolph BongAdam Kendall MD.   Encounter Date: 04/11/2018  PT End of Session - 04/11/18 1555    Visit Number  2    Number of Visits  12    Date for PT Re-Evaluation  05/07/18    PT Start Time  0315    PT Stop Time  0408    PT Time Calculation (min)  53 min    Activity Tolerance  Patient tolerated treatment well    Behavior During Therapy  [facility] for tasks assessed/performed       Past Medical History:  Diagnosis Date  . ADHD (attention deficit hyperactivity disorder)     History reviewed. No pertinent surgical history.  There were no vitals filed for this visit.  Subjective Assessment - 04/11/18 1555    Subjective  No new complaints.    Patient Stated Goals  Get out of pain.    Currently in Pain?  Yes    Pain Score  4     Pain Orientation  Right;Left;Mid    Pain Descriptors / Indicators  Aching    Pain Type  Acute pain    Pain Onset  More than a month ago    Pain Score  4    Pain Location  Chest    Pain Orientation  Left    Pain Descriptors / Indicators  Aching;Sharp    Pain Onset  More than a month ago                       [facility]/Exercise - 04/11/18 0001      Exercises   Exercises  Shoulder      Shoulder Exercises: ROM/Strengthening   UBE (Upper Arm Bike)  6 minutes (3 minutes forward and 3 minutes backward) at 90 RPM's.      Modalities   Modalities  Electrical Stimulation;Moist Heat      Moist Heat Therapy   Number Minutes Moist Heat  20 Minutes    Moist Heat Location  -- Thoracic.      Scientist, research (physical sciences)lectrical Stimulation   Electrical Stimulation Location  Mid-thoracic    Electrical Stimulation Action  IFC    Electrical Stimulation Parameters  80-150 Hz x 20 minutes.     Electrical Stimulation Goals  Pain      Manual Therapy   Manual Therapy  Soft tissue mobilization    Soft tissue mobilization  IASTM x 5 minutes to left pect major (especially near humeral attachment) f/b STW/M x 12 minutes to affected thoracic region in prone.             PT Education - 04/11/18 1555    Education Details  Instruct in chin tucks and cervical extension.          PT Long Term Goals - 04/09/18 1913      PT LONG TERM GOAL #1   Title  Independent with an HEP.    Time  4    Period  Weeks    Status  New      PT LONG TERM GOAL #2   Title  perform ADL's and work activities with pain not > 2/10.    Time  4    Period  Weeks    Status  New  Plan - 04/11/18 1602    Clinical Impression Statement  Excellent response to treatment today.  Increasaed over left pect major humeral attachment though he responded very well to IASTM.    PT Treatment/Interventions  ADLs/Self Care Home Management;Cryotherapy;Electrical Stimulation;Ultrasound;Therapeutic activities;Therapeutic exercise;Patient/family education;Manual techniques;Dry needling    PT Next Visit Plan  IASTM to left pect major; mid-thoracic mobs; chin tuck and cervical extension; corner stretch and scapular retratction with resistance; U/S, HMP and e'stim.  Foam roller to thoracic spine.       Patient will benefit from skilled therapeutic intervention in order to improve the following deficits and impairments:  Decreased activity tolerance, Increased muscle spasms, Postural dysfunction, Pain  Visit Diagnosis: Pain in thoracic spine  Abnormal posture     Problem List Patient Active Problem List   Diagnosis Date Noted  . Attention deficit hyperactivity disorder (ADHD) 04/27/2016    Thomas Wolfe, [place] MPT 04/11/2018, 4:35 PM  [facility] [address] [place], [place], [address] Phone: [phone]   Fax:  [phone]  Name: Thomas Wolfe MRN: [id number] Date of Birth: 1996/02/11

## 2018-04-16 ENCOUNTER — Other Ambulatory Visit: Payer: Self-pay | Admitting: Gastroenterology

## 2018-04-16 ENCOUNTER — Ambulatory Visit: Payer: 59 | Admitting: *Deleted

## 2018-04-16 DIAGNOSIS — R293 Abnormal posture: Secondary | ICD-10-CM

## 2018-04-16 DIAGNOSIS — M546 Pain in thoracic spine: Secondary | ICD-10-CM | POA: Diagnosis not present

## 2018-04-16 DIAGNOSIS — R945 Abnormal results of liver function studies: Principal | ICD-10-CM

## 2018-04-16 DIAGNOSIS — R7989 Other specified abnormal findings of blood chemistry: Secondary | ICD-10-CM

## 2018-04-16 NOTE — Therapy (Signed)
[facility] [address] [place], [place], [address] Phone: [phone]   Fax:  [phone]  Physical Therapy Treatment  Patient Details  Name: Thomas Wolfe MRN: [id number] Date of Birth: 09/28/1995 Referring Provider: Rodolph BongAdam Kendall MD.   Encounter Date: 04/16/2018  PT End of Session - 04/16/18 1520    Visit Number  3    Number of Visits  12    Date for PT Re-Evaluation  05/07/18    PT Start Time  1515    PT Stop Time  1606    PT Time Calculation (min)  51 min       Past Medical History:  Diagnosis Date  . ADHD (attention deficit hyperactivity disorder)     No past surgical history on file.  There were no vitals filed for this visit.  Subjective Assessment - 04/16/18 1520    Subjective  Sore after last Rx, but I think it helped    Patient Stated Goals  Get out of pain.    Currently in Pain?  Yes    Pain Score  4     Pain Location  Back    Pain Orientation  Right;Left;Mid    Pain Descriptors / Indicators  Aching    Pain Type  Acute pain    Pain Onset  More than a month ago    Pain Frequency  Constant    Multiple Pain Sites  Yes    Pain Score  4    Pain Location  Chest    Pain Orientation  Left    Pain Descriptors / Indicators  Aching;Sore    Pain Type  Acute pain    Pain Onset  More than a month ago                       [facility] - 04/16/18 0001      Exercises   Exercises  Shoulder      Shoulder Exercises: ROM/Strengthening   UBE (Upper Arm Bike)  6 minutes (3 minutes forward and 3 minutes backward) at 90 RPM's.      Modalities   Modalities  Electrical Stimulation;Moist Heat      Moist Heat Therapy   Number Minutes Moist Heat  15 Minutes    Moist Heat Location  -- Thoracic.      Public affairs consultantlectrical Stimulation   Electrical Stimulation Location  Mid-thoracic    Electrical Stimulation Goals  Pain      Manual Therapy   Manual Therapy  Soft tissue mobilization    Soft  tissue mobilization  IASTM x 8 minutes to left pect major f/b STW/M x 12 minutes to affected thoracic region in prone.       Chin tucks x 10             PT Long Term Goals - 04/09/18 1913      PT LONG TERM GOAL #1   Title  Independent with an HEP.    Time  4    Period  Weeks    Status  New      PT LONG TERM GOAL #2   Title  perform ADL's and work activities with pain not > 2/10.    Time  4    Period  Weeks    Status  New            Plan - 04/16/18 1528    Clinical Impression Statement  Pt arrived today doing fair ,  but reports LT pec mm being sore and both sides in mid-back B/W shldr blades. Pt did well with STW and had notable tautness in pec mm belly and TPs around T7-8 both sides with good release. Pt reported referred pain to shldrs during TP release.. Pt was also instructed in chin tucks and given for HEP>    Clinical Presentation  Evolving    Clinical Decision Making  Low    Rehab Potential  Excellent    PT Frequency  3x / week    PT Duration  4 weeks    PT Treatment/Interventions  ADLs/Self Care Home Management;Cryotherapy;Electrical Stimulation;Ultrasound;Therapeutic activities;Therapeutic exercise;Patient/family education;Manual techniques;Dry needling    PT Next Visit Plan  IASTM to left pect major; mid-thoracic mobs; chin tuck and cervical extension; corner stretch and scapular retratction with resistance; U/S, HMP and e'stim.  Foam roller to thoracic spine.       Patient will benefit from skilled therapeutic intervention in order to improve the following deficits and impairments:  Decreased activity tolerance, Increased muscle spasms, Postural dysfunction, Pain  Visit Diagnosis: Abnormal posture  Pain in thoracic spine     Problem List Patient Active Problem List   Diagnosis Date Noted  . Attention deficit hyperactivity disorder (ADHD) 04/27/2016    [name],[name], PTA 04/16/2018, 6:14 PM  [facility] [address] [place], [place], [address] Phone: [phone]   Fax:  [phone]  Name: Thomas Wolfe MRN: [id number] Date of Birth: [birth year]

## 2018-04-18 ENCOUNTER — Ambulatory Visit
Admission: RE | Admit: 2018-04-18 | Discharge: 2018-04-18 | Disposition: A | Discharge status: Home / Self Care | Payer: 59 | Source: Ambulatory Visit | Attending: Gastroenterology | Admitting: Gastroenterology

## 2018-04-18 ENCOUNTER — Ambulatory Visit: Payer: 59 | Admitting: *Deleted

## 2018-04-18 DIAGNOSIS — M546 Pain in thoracic spine: Secondary | ICD-10-CM

## 2018-04-18 DIAGNOSIS — R293 Abnormal posture: Secondary | ICD-10-CM

## 2018-04-18 NOTE — Therapy (Signed)
[facility] [address] [place], [place], [address] Phone: [phone]   Fax:  [phone]  Physical Therapy Treatment  Patient Details  Name: Thomas Wolfe MRN: [id number] Date of Birth: May 30, 1996 Referring Provider: Rodolph Bong MD.   Encounter Date: 04/18/2018  PT End of Session - 04/18/18 1746    Visit Number  4    Number of Visits  12    Date for PT Re-Evaluation  05/07/18    PT Start Time  1515    PT Stop Time  1606    PT Time Calculation (min)  51 min       Past Medical History:  Diagnosis Date  . ADHD (attention deficit hyperactivity disorder)     No past surgical history on file.  There were no vitals filed for this visit.  Subjective Assessment - 04/18/18 1538    Subjective  Sore after last Rx, but I think it helped    Patient Stated Goals  Get out of pain.    Currently in Pain?  Yes    Pain Score  4     Pain Orientation  Left;Mid;Right    Pain Descriptors / Indicators  Aching    Pain Type  Acute pain    Pain Onset  More than a month ago    Pain Frequency  Constant    Pain Score  8    Pain Location  Chest    Pain Orientation  Left    Pain Descriptors / Indicators  Aching;Sore    Pain Type  Acute pain    Pain Onset  More than a month ago                       [facility] Adult PT Treatment/Exercise - 04/18/18 0001      Exercises   Exercises  Shoulder      Shoulder Exercises: Standing   Theraband Level (Shoulder ABduction)  Level 3 (Green)   3x10 but Pt complained of LT ACJ pain and  referred pain   Row  Strengthening;Both   XTS pink 4x10     Shoulder Exercises: ROM/Strengthening   UBE (Upper Arm Bike)  6 minutes (3 minutes forward and 3 minutes backward) at 90 RPM's.    Nustep  8 mins at 90 RPMs      Modalities   Modalities  Electrical Stimulation;Moist Heat      Moist Heat Therapy   Number Minutes Moist Heat  15 Minutes    Moist Heat Location  Shoulder   Thoracic.     Programme researcher, broadcasting/film/video Location  LT pec insertion, ACJ  IFC x 15 mins 80-150hz  in sitting    Electrical Stimulation Goals  Pain      Manual Therapy   Manual Therapy  Soft tissue mobilization    Soft tissue mobilization  STW to LT pec, ACJ and TPR to LT Utrap                  PT Long Term Goals - 04/09/18 1913      PT LONG TERM GOAL #1   Title  Independent with an HEP.    Time  4    Period  Weeks    Status  New      PT LONG TERM GOAL #2   Title  perform ADL's and work activities with pain not > 2/10.    Time  4    Period  Weeks  Status  New            Plan - 04/18/18 1540    Clinical Impression Statement  Pt arrived today doing fairly well after last Rx with mainly soreness. His CC today was LT pect. mm and around ACJ as well as LT UT. He did well with Standing Rows, but had increased pain in LT Pect., ACJ, and pain along LT UT and into his neck with horizontal abduction. Tedra Coupe. Rows were given for HEP with a hold on H-abduction at this time. Rx focused on LT pect. and shldr today due to increased  pain in this area. Pt did well with Rx and had decreased pain after Rx. Notable tender ness along LT pect, ACJ, and TP found in LT Utrap    Clinical Presentation  Evolving    Rehab Potential  Excellent    PT Frequency  3x / week    PT Treatment/Interventions  ADLs/Self Care Home Management;Cryotherapy;Electrical Stimulation;Ultrasound;Therapeutic activities;Therapeutic exercise;Patient/family education;Manual techniques;Dry needling    PT Next Visit Plan  IASTM to left pect major; mid-thoracic mobs; chin tuck and cervical extension; corner stretch and scapular retratction with resistance; U/S, HMP and e'stim.  Foam roller to thoracic spine.    Consulted and Agree with Plan of Care  Patient       Patient will benefit from skilled therapeutic intervention in order to improve the following deficits and impairments:  Decreased activity tolerance,  Increased muscle spasms, Postural dysfunction, Pain  Visit Diagnosis: Abnormal posture  Pain in thoracic spine     Problem List Patient Active Problem List   Diagnosis Date Noted  . Attention deficit hyperactivity disorder (ADHD) 04/27/2016    Aleshka Corney,[name], PTA  04/18/2018, 5:47 PM  [facility] [address] [place], [place], [address] Phone: [phone]   Fax:  [phone]  Name: Thomas Wolfe MRN: [id number] Date of Birth: 04/25/1996

## 2018-04-23 ENCOUNTER — Ambulatory Visit: Payer: 59 | Admitting: Physical Therapy

## 2018-04-23 ENCOUNTER — Encounter: Payer: Self-pay | Admitting: Physical Therapy

## 2018-04-23 DIAGNOSIS — M546 Pain in thoracic spine: Secondary | ICD-10-CM

## 2018-04-23 NOTE — Therapy (Signed)
[facility] [address] [place], [place], [address] Phone: [phone]   Fax:  [phone]  Physical Therapy Treatment  Patient Details  Name: Thomas Wolfe Frandsen MRN: [id number] Date of Birth: 03/07/1996 Referring Provider: Rodolph BongAdam Kendall MD.   Encounter Date: 04/23/2018  PT End of Session - 04/23/18 1551    Visit Number  5    Number of Visits  12    Date for PT Re-Evaluation  05/07/18    PT Start Time  0315    PT Stop Time  0407    PT Time Calculation (min)  52 min       Past Medical History:  Diagnosis Date  . ADHD (attention deficit hyperactivity disorder)     History reviewed. No pertinent surgical history.  There were no vitals filed for this visit.  Subjective Assessment - 04/23/18 1552    Subjective  My chest hasn't hurt in 2 days.  My mid-back feels better too.  I started walking and went from one mile to three miles and then tried jogging.  This hurt my low back.    Currently in Pain?  Yes    Pain Score  3     Pain Location  Back    Pain Orientation  Left;Mid;Right    Pain Descriptors / Indicators  Aching;Dull    Pain Type  Acute pain    Pain Onset  More than a month ago    Pain Score  0    Pain Location  Chest    Pain Orientation  Left    Pain Descriptors / Indicators  Aching;Sore    Pain Type  Acute pain    Pain Onset  More than a month ago                       [facility] Treatment/Exercise - 04/23/18 0001      Exercises   Exercises  Shoulder      Shoulder Exercises: ROM/Strengthening   UBE (Upper Arm Bike)  8 minutes at 90 RPM's.      Modalities   Modalities  Electrical Stimulation;Moist Heat      Moist Heat Therapy   Number Minutes Moist Heat  20 Minutes    Moist Heat Location  --   Thoracic.     Market researcherlectrical Stimulation   Electrical Stimulation Location  Thoracic.    Electrical Stimulation Action  IFC    Electrical Stimulation Parameters  80-150 Hz x 20 minutes.     Electrical Stimulation Goals  Pain      Manual Therapy   Manual Therapy  Soft tissue mobilization    Soft tissue mobilization  Prone:  STW/M x 15 minutes to affected spinal musculature including gentle thoracic PA and costo-vetebral mobs.             PT Education - 04/23/18 1554    Education Details  Patient instructed in prone leg lifts and alternate arm/leg lifts.          PT Long Term Goals - 04/09/18 1913      PT LONG TERM GOAL #1   Title  Independent with an HEP.    Time  4    Period  Weeks    Status  New      PT LONG TERM GOAL #2   Title  perform ADL's and work activities with pain not > 2/10.    Time  4    Period  Weeks  Status  New            Plan - 04/23/18 1558    Clinical Impression Statement  Great response to treatments thus far.  He had no complaints of left pec pain and his thoracic pain was low.  He did, however, cause himself low back pain when jogging.  Told him to stop jogging and back down walking to no more than 2 miles until pain subsides.    PT Treatment/Interventions  ADLs/Self Care Home Management;Cryotherapy;Electrical Stimulation;Ultrasound;Therapeutic activities;Therapeutic exercise;Patient/family education;Manual techniques;Dry needling    PT Next Visit Plan  IASTM to left pect major; mid-thoracic mobs; chin tuck and cervical extension; corner stretch and scapular retratction with resistance; U/S, HMP and e'stim.  Foam roller to thoracic spine.    Consulted and Agree with Plan of Care  Patient       Patient will benefit from skilled therapeutic intervention in order to improve the following deficits and impairments:  Decreased activity tolerance, Increased muscle spasms, Postural dysfunction, Pain  Visit Diagnosis: Pain in thoracic spine     Problem List Patient Active Problem List   Diagnosis Date Noted  . Attention deficit hyperactivity disorder (ADHD) 04/27/2016    Kennede Lusk, Italy[place] MPT 04/23/2018, 5:01 PM  [facility] [address] [place], [place], [address] Phone: [phone]   Fax:  [phone]  Name: Thomas Wolfe Wedemeyer MRN: [id number] Date of Birth: 09/03/1996

## 2018-04-25 ENCOUNTER — Ambulatory Visit: Payer: 59 | Admitting: *Deleted

## 2018-04-25 DIAGNOSIS — M546 Pain in thoracic spine: Secondary | ICD-10-CM

## 2018-04-25 DIAGNOSIS — R293 Abnormal posture: Secondary | ICD-10-CM

## 2018-04-25 NOTE — Therapy (Signed)
[facility] [address] [place], [place], [address] Phone: [phone]   Fax:  [phone]  Physical Therapy Treatment  Patient Details  Name: Thomas Wolfe Nowak MRN: [id number] Date of Birth: 10/21/1995 Referring Provider: Rodolph BongAdam Kendall MD.   Encounter Date: 04/25/2018  PT End of Session - 04/25/18 1608    Visit Number  6    Number of Visits  12    Date for PT Re-Evaluation  05/07/18    PT Start Time  1515    PT Stop Time  1605    PT Time Calculation (min)  50 min       Past Medical History:  Diagnosis Date  . ADHD (attention deficit hyperactivity disorder)     No past surgical history on file.  There were no vitals filed for this visit.  Subjective Assessment - 04/25/18 1521    Subjective  LT shldr/ chest  is a little sore today. Top of shldr hurts at times    Patient Stated Goals  Get out of pain.    Currently in Pain?  Yes    Pain Score  3     Pain Location  Back    Pain Descriptors / Indicators  Aching;Dull    Pain Type  Acute pain    Pain Onset  More than a month ago    Pain Score  3    Pain Location  Shoulder    Pain Orientation  Left    Pain Descriptors / Indicators  Aching;Sore    Pain Onset  More than a month ago                       [facility]/Exercise - 04/25/18 0001      Exercises   Exercises  Shoulder      Shoulder Exercises: Standing   External Rotation  Strengthening;Left;20 reps;10 reps   increased   Theraband Level (Shoulder External Rotation)  Level 1 (Yellow)      Shoulder Exercises: ROM/Strengthening   UBE (Upper Arm Bike)  8 minutes at 60 RPM's.      Modalities   Modalities  Electrical Stimulation;Moist Heat;Ultrasound      Moist Heat Therapy   Number Minutes Moist Heat  15 Minutes    Moist Heat Location  Shoulder      Electrical Stimulation   Electrical Stimulation Location  LT pec insertion, ACJ  IFC x 15 mins 80-150hz  in sitting    Electrical  Stimulation Goals  Pain      Ultrasound   Ultrasound Location  lateral deltoid/posterior cuff    Ultrasound Parameters  1.5 w/cm2 x 8 mins combo    Ultrasound Goals  Pain      Manual Therapy   Manual Therapy  Soft tissue mobilization    Soft tissue mobilization  sittting  STW LT ACJ, IASTM/STW  pect.superior aspect below collarbone and along  posterior cuff                  PT Long Term Goals - 04/09/18 1913      PT LONG TERM GOAL #1   Title  Independent with an HEP.    Time  4    Period  Weeks    Status  New      PT LONG TERM GOAL #2   Title  perform ADL's and work activities with pain not > 2/10.    Time  4    Period  Weeks    Status  New            Plan - 04/25/18 1536    Clinical Impression Statement  Pt arrives today feeling better in mid-back, but having more pain/discomfort in LT pec and shldr. He was able to perform UBE without discomfort, but reported pain/discomfort with tband ER. US combo was performed in areas where pain was reported lateral aspect LT shldr and posterior cuff area and did well. STW was also performed in this area as well as ACJ and Pect major. Rx tolerated well and focused on LT chest/shldr area today  as per pt.'s request.    Clinical Presentation  Evolving    Rehab Potential  Excellent    PT Frequency  3x / week    PT Duration  4 weeks    PT Treatment/Interventions  ADLs/Self Care Home Management;Cryotherapy;Electrical Stimulation;Ultrasound;Therapeutic activities;Therapeutic exercise;Patient/family education;Manual techniques;Dry needling    PT Next Visit Plan  IASTM to left pect major; mid-thoracic mobs; chin tuck and cervical extension; corner stretch and scapular retratction with resistance; U/S, HMP and e'stim.  Foam roller to thoracic spine.       Patient will benefit from skilled therapeutic intervention in order to improve the following deficits and impairments:  Decreased activity tolerance, Increased muscle spasms,  Postural dysfunction, Pain  Visit Diagnosis: Pain in thoracic spine  Abnormal posture     Problem List Patient Active Problem List   Diagnosis Date Noted  . Attention deficit hyperactivity disorder (ADHD) 04/27/2016    Jermery Caratachea,[name], PTA 04/25/2018, 4:37 PM  [facility] [address] [place], [place], [address] Phone: [phone]   Fax:  [phone]  Name: Thomas Wolfe [place]ton MRN: [id number] Date of Birth: 11/04/1995

## 2018-04-30 ENCOUNTER — Ambulatory Visit: Payer: 59 | Admitting: *Deleted

## 2018-04-30 DIAGNOSIS — M546 Pain in thoracic spine: Secondary | ICD-10-CM

## 2018-04-30 DIAGNOSIS — R293 Abnormal posture: Secondary | ICD-10-CM

## 2018-04-30 NOTE — Therapy (Signed)
[facility] [address] [place], [place], [address] Phone: [phone]   Fax:  [phone]  Physical Therapy Treatment  Patient Details  Name: Thomas Wolfe MRN: [id number] Date of Birth: 10/22/1995 Referring Provider: Rodolph BongAdam Kendall MD.   Encounter Date: 04/30/2018  PT End of Session - 04/30/18 1629    Visit Number  7    Number of Visits  12    Date for PT Re-Evaluation  05/07/18    PT Start Time  1515    PT Stop Time  1605    PT Time Calculation (min)  50 min       Past Medical History:  Diagnosis Date  . ADHD (attention deficit hyperactivity disorder)     No past surgical history on file.  There were no vitals filed for this visit.  Subjective Assessment - 04/30/18 1521    Subjective  LT shldr and chest did real good after last Rx and aren't bothering me today. It's my RT midback/shldr blade area.     Patient Stated Goals  Get out of pain.    Currently in Pain?  Yes    Pain Score  2     Pain Location  Back    Pain Orientation  Right;Left;Mid    Pain Descriptors / Indicators  Aching;Sore    Pain Type  Acute pain    Pain Onset  More than a month ago                       [facility] Treatment/Exercise - 04/30/18 0001      Exercises   Exercises  Shoulder      Shoulder Exercises: ROM/Strengthening   UBE (Upper Arm Bike)  8 minutes at 60 RPM's.      Modalities   Modalities  Electrical Stimulation;Moist Heat;Ultrasound      Moist Heat Therapy   Number Minutes Moist Heat  15 Minutes    Moist Heat Location  Shoulder   RT / LT thoracic     Electrical Stimulation   Electrical Stimulation Location  Thoracic.   IFC x 15 mins 80-150hz  prone    Electrical Stimulation Goals  Pain      Ultrasound   Ultrasound Location  RT side thoracic paras T1-3    Ultrasound Parameters   Combo 1.5 w/cm2 x 10 mins prone    Ultrasound Goals  Pain      Manual Therapy   Manual Therapy  Soft tissue mobilization     Soft tissue mobilization  Prone STW / IASTM  to RT side thoracic paras as well as rhomboids                  PT Long Term Goals - 04/09/18 1913      PT LONG TERM GOAL #1   Title  Independent with an HEP.    Time  4    Period  Weeks    Status  New      PT LONG TERM GOAL #2   Title  perform ADL's and work activities with pain not > 2/10.    Time  4    Period  Weeks    Status  New            Plan - 04/30/18 1629    Clinical Impression Statement  Pt arrived today doing better with decreased pain in LT pect/ shldr area after last Rx and wanted to focus on soreness /pain in  RT thoracic / scapular area. He was able to perform UBE again with  minimal pain, but had notable tenderness and TPs along thoracic paras and into rhomboids RT side. Good response to Rx with tension release end of session.    Rehab Potential  Excellent    PT Frequency  3x / week    PT Duration  4 weeks    PT Treatment/Interventions  ADLs/Self Care Home Management;Cryotherapy;Electrical Stimulation;Ultrasound;Therapeutic activities;Therapeutic exercise;Patient/family education;Manual techniques;Dry needling    PT Next Visit Plan  IASTM to left pect major; mid-thoracic mobs; chin tuck and cervical extension; corner stretch and scapular retratction with resistance; U/S, HMP and e'stim.  Foam roller to thoracic spine.    Consulted and Agree with Plan of Care  Patient       Patient will benefit from skilled therapeutic intervention in order to improve the following deficits and impairments:  Decreased activity tolerance, Increased muscle spasms, Postural dysfunction, Pain  Visit Diagnosis: Pain in thoracic spine  Abnormal posture     Problem List Patient Active Problem List   Diagnosis Date Noted  . Attention deficit hyperactivity disorder (ADHD) 04/27/2016    [name],[name], PTA 04/30/2018, 4:35 PM  [facility] [address] [place], [place],  [address] Phone: [phone]   Fax:  [phone]  Name: Thomas Wolfe MRN: [id number] Date of Birth: 09/04/1996

## 2018-05-02 ENCOUNTER — Ambulatory Visit: Payer: 59 | Admitting: *Deleted

## 2018-05-02 DIAGNOSIS — R293 Abnormal posture: Secondary | ICD-10-CM

## 2018-05-02 DIAGNOSIS — M546 Pain in thoracic spine: Secondary | ICD-10-CM | POA: Diagnosis not present

## 2018-05-02 NOTE — Therapy (Signed)
[facility] [address] [place], [place], [address] Phone: [phone]   Fax:  [phone]  Physical Therapy Treatment  Patient Details  Name: Thomas Wolfe MRN: [id number] Date of Birth: June 04, 1996 Referring Provider: Rodolph Bong MD.   Encounter Date: 05/02/2018  PT End of Session - 05/02/18 1616    Visit Number  8    Number of Visits  12    Date for PT Re-Evaluation  05/07/18    PT Start Time  1515    PT Stop Time  1606    PT Time Calculation (min)  51 min       Past Medical History:  Diagnosis Date  . ADHD (attention deficit hyperactivity disorder)     No past surgical history on file.  There were no vitals filed for this visit.  Subjective Assessment - 05/02/18 1527    Subjective  LT shldr and chest did real good after last Rx and aren't bothering me today. It's my RT midback/shldr blade area again    Currently in Pain?  Yes    Pain Score  3     Pain Location  Back    Pain Orientation  Right;Mid    Pain Descriptors / Indicators  Aching;Sore    Pain Onset  More than a month ago                       [facility] Adult PT Treatment/Exercise - 05/02/18 0001      Exercises   Exercises  Shoulder      Shoulder Exercises: ROM/Strengthening   UBE (Upper Arm Bike)  8 minutes at 60 RPM's.      Modalities   Modalities  Electrical Stimulation;Moist Heat;Ultrasound      Moist Heat Therapy   Number Minutes Moist Heat  15 Minutes    Moist Heat Location  Shoulder      Electrical Stimulation   Electrical Stimulation Location  Thoracic.   IFC x 15 mins 80-150hz  prone    Electrical Stimulation Goals  Pain      Ultrasound   Ultrasound Location  RT side periscapular medial border    Ultrasound Parameters  [place] combo 1.5 w/cm2 x 10 mins    Ultrasound Goals  Pain      Manual Therapy   Manual Therapy  Soft tissue mobilization    Soft tissue mobilization  Prone STW / IASTM  to RT side thoracic paras as well as  rhomboids                  PT Long Term Goals - 05/02/18 1617      PT LONG TERM GOAL #1   Title  Independent with an HEP.    Time  4    Period  Weeks    Status  On-going      PT LONG TERM GOAL #2   Title  perform ADL's and work activities with pain not > 2/10.    Time  4    Period  Weeks    Status  On-going            Plan - 05/02/18 1618    Clinical Impression Statement  Pt arrives today doing fairly well with minimal pain complaints about LT pect/thoracic area. His CC was RT sided pain  again in Thoracic/scapular musculature. He did well with Rx again with decreased pain with US/combo and STW with good TP release. Still unable to meet LTG for fuction  due to pain levels greater than 2/10 and is ongoing at this time    Clinical Presentation  Evolving    Rehab Potential  Excellent    PT Frequency  3x / week    PT Duration  4 weeks    PT Next Visit Plan  IASTM to left pect major; mid-thoracic mobs; chin tuck and cervical extension; corner stretch and scapular retratction with resistance; U/S, HMP and e'stim.  Foam roller to thoracic spine.    Consulted and Agree with Plan of Care  Patient       Patient will benefit from skilled therapeutic intervention in order to improve the following deficits and impairments:  Decreased activity tolerance, Increased muscle spasms, Postural dysfunction, Pain  Visit Diagnosis: Pain in thoracic spine  Abnormal posture     Problem List Patient Active Problem List   Diagnosis Date Noted  . Attention deficit hyperactivity disorder (ADHD) 04/27/2016    Yuleni Burich,[name], PTA 05/02/2018, 5:29 PM  [facility] [address] [place], [place], [address] Phone: [phone]   Fax:  [phone]  Name: Thomas Wolfe MRN: [id number] Date of Birth: 07/01/1996

## 2018-05-07 ENCOUNTER — Ambulatory Visit: Payer: 59 | Admitting: *Deleted

## 2018-05-07 DIAGNOSIS — R293 Abnormal posture: Secondary | ICD-10-CM

## 2018-05-07 DIAGNOSIS — M546 Pain in thoracic spine: Secondary | ICD-10-CM

## 2018-05-07 NOTE — Therapy (Signed)
[facility] [address] [place], [place], [address] Phone: [phone]   Fax:  [phone]  Physical Therapy Treatment  Patient Details  Name: Thomas Wolfe MRN: [id number] Date of Birth: 04/27/1996 Referring Provider: Rodolph Bong MD.   Encounter Date: 05/07/2018  PT End of Session - 05/07/18 1528    Visit Number  9    Number of Visits  12    Date for PT Re-Evaluation  05/07/18    PT Start Time  1515    PT Stop Time  1605    PT Time Calculation (min)  50 min       Past Medical History:  Diagnosis Date  . ADHD (attention deficit hyperactivity disorder)     No past surgical history on file.  There were no vitals filed for this visit.  Subjective Assessment - 05/07/18 1521    Subjective  Rt shldr blade is still bothering me at times, but evey where else feels pretty good. 3/10 today. MD F/U 05-17-18    Patient Stated Goals  Get out of pain.    Currently in Pain?  Yes    Pain Score  3     Pain Location  Back    Pain Orientation  Right;Mid    Pain Type  Acute pain    Pain Onset  More than a month ago    Pain Frequency  Constant    Pain Location  Shoulder                       [facility] Adult PT Treatment/Exercise - 05/07/18 0001      Exercises   Exercises  Shoulder      Shoulder Exercises: ROM/Strengthening   UBE (Upper Arm Bike)  8 minutes at 60 RPM's.      Modalities   Modalities  Electrical Stimulation;Moist Heat;Ultrasound      Moist Heat Therapy   Number Minutes Moist Heat  15 Minutes    Moist Heat Location  Shoulder      Electrical Stimulation   Electrical Stimulation Location  Thoracic.   IFC x 15 mins 80-150hz  prone    Electrical Stimulation Goals  Pain      Ultrasound   Ultrasound Location  RT side thoracic/periscapular    Ultrasound Parameters  Combo x12 mins 1.5 w/cm2 prone    Ultrasound Goals  Pain      Manual Therapy   Manual Therapy  Soft tissue mobilization    Soft tissue  mobilization  Prone STW / IASTM  to RT side thoracic paras as well as rhomboids                  PT Long Term Goals - 05/02/18 1617      PT LONG TERM GOAL #1   Title  Independent with an HEP.    Time  4    Period  Weeks    Status  On-going      PT LONG TERM GOAL #2   Title  perform ADL's and work activities with pain not > 2/10.    Time  4    Period  Weeks    Status  On-going            Plan - 05/07/18 1817    Clinical Impression Statement  Pt arrived today doing fairly well still with mainly discomfort in RT side thoracic/ periscapular area. He continues to do well with LT pect and LT side thoracic area.Marland Kitchen  He still has notable soreness in RT side and TPs as well. Normal response to modalities today.    Clinical Presentation  Evolving    Clinical Decision Making  Low    Rehab Potential  Excellent    PT Frequency  3x / week    PT Duration  4 weeks    PT Treatment/Interventions  ADLs/Self Care Home Management;Cryotherapy;Electrical Stimulation;Ultrasound;Therapeutic activities;Therapeutic exercise;Patient/family education;Manual techniques;Dry needling    PT Next Visit Plan  IASTM to left pect major; mid-thoracic mobs; chin tuck and cervical extension; corner stretch and scapular retratction with resistance; U/S, HMP and e'stim.  Foam roller to thoracic spine.    Consulted and Agree with Plan of Care  Patient       Patient will benefit from skilled therapeutic intervention in order to improve the following deficits and impairments:  Decreased activity tolerance, Increased muscle spasms, Postural dysfunction, Pain  Visit Diagnosis: Pain in thoracic spine  Abnormal posture     Problem List Patient Active Problem List   Diagnosis Date Noted  . Attention deficit hyperactivity disorder (ADHD) 04/27/2016    Makeila Yamaguchi,[name], PTA 05/07/2018, 6:24 PM  [facility] [address] [place], [place], [address] Phone:  [phone]   Fax:  [phone]  Name: Thomas Blendicholas D Wolfe MRN: [id number] Date of Birth: 03/06/1996

## 2018-05-09 ENCOUNTER — Ambulatory Visit: Payer: 59 | Admitting: *Deleted

## 2018-05-09 DIAGNOSIS — M546 Pain in thoracic spine: Secondary | ICD-10-CM

## 2018-05-09 DIAGNOSIS — R293 Abnormal posture: Secondary | ICD-10-CM

## 2018-05-09 NOTE — Therapy (Signed)
[facility] [place], [place], [address] Phone: [phone]   Fax:  [phone]  Physical Therapy Treatment  Patient Details  Name: Thomas Wolfe MRN: [id number] Date of Birth: Jun 13, 1996 Referring Provider: Vickki Hearing MD.   Encounter Date: 05/09/2018  PT End of Session - 05/09/18 1520    Visit Number  10    Number of Visits  12    Date for PT Re-Evaluation  05/07/18    PT Start Time  2831    PT Stop Time  1605    PT Time Calculation (min)  50 min       Past Medical History:  Diagnosis Date  . ADHD (attention deficit hyperactivity disorder)     No past surgical history on file.  There were no vitals filed for this visit.  Subjective Assessment - 05/09/18 1519    Subjective  Rt shldr blade is still bothering me at times, but evey where else feels pretty good. 3/10 today. MD F/U 05-17-18    Patient Stated Goals  Get out of pain.    Currently in Pain?  Yes    Pain Score  3     Pain Location  Back    Pain Orientation  Right    Pain Descriptors / Indicators  Aching    Pain Type  Acute pain    Pain Onset  More than a month ago    Pain Location  Shoulder    Pain Descriptors / Indicators  Aching;Sore                       [facility] Adult PT Treatment/Exercise - 05/09/18 0001      Exercises   Exercises  Shoulder      Shoulder Exercises: Standing   External Rotation  Strengthening;Both;Theraband   3 x fatigue each UE   Theraband Level (Shoulder External Rotation)  Level 2 (Red)      Shoulder Exercises: ROM/Strengthening   UBE (Upper Arm Bike)  8 minutes at 60 RPM's.      Modalities   Modalities  Electrical Stimulation;Moist Heat;Ultrasound      Moist Heat Therapy   Number Minutes Moist Heat  15 Minutes    Moist Heat Location  Shoulder      Electrical Stimulation   Electrical Stimulation Location  Thoracic.   IFC x 15 mins 80-150hz  prone    Electrical Stimulation Goals  Pain      Ultrasound   Ultrasound Location  RT thoracic paras/levator scap    Ultrasound Parameters  1.5 w/cm2 x 12 mins    Ultrasound Goals  Pain      Manual Therapy   Manual Therapy  Soft tissue mobilization    Soft tissue mobilization  Prone STW / IASTM  to RT side thoracic paras as well as rhomboids/ Levator                  PT Long Term Goals - 05/09/18 1615      PT LONG TERM GOAL #1   Title  Independent with an HEP.    Time  4    Period  Weeks    Status  Achieved      PT LONG TERM GOAL #2   Title  perform ADL's and work activities with pain not > 2/10.    Time  4    Period  Weeks    Status  Partially Met  Plan - 05/09/18 1616    Clinical Impression Statement  Pt arrived today reporting less pain RT thoracic area today and no pain LT pect and LT side thoracic area. He continues to progress with decreased pain in all areas. TPs still noted in RT levator and Rhomboids , but less. Normal modality response.    Clinical Presentation  Evolving    Rehab Potential  Excellent    PT Frequency  3x / week    PT Duration  4 weeks    PT Treatment/Interventions  ADLs/Self Care Home Management;Cryotherapy;Electrical Stimulation;Ultrasound;Therapeutic activities;Therapeutic exercise;Patient/family education;Manual techniques;Dry needling    PT Next Visit Plan  IASTM to left pect major; mid-thoracic mobs; chin tuck and cervical extension; corner stretch and scapular retratction with resistance; U/S, HMP and e'stim.  Foam roller to thoracic spine.    Consulted and Agree with Plan of Care  Patient       Patient will benefit from skilled therapeutic intervention in order to improve the following deficits and impairments:  Decreased activity tolerance, Increased muscle spasms, Postural dysfunction, Pain  Visit Diagnosis: Pain in thoracic spine  Abnormal posture     Problem List Patient Active Problem List   Diagnosis Date Noted  . Attention deficit hyperactivity  disorder (ADHD) 04/27/2016    Laurence Crofford,[name], PTA 05/09/2018, 4:20 PM  [facility] [place], [place], [address] Phone: [phone]   Fax:  [phone]  Name: Thomas Wolfe MRN: [id number] Date of Birth: 24-Jun-1996

## 2018-05-14 ENCOUNTER — Ambulatory Visit: Payer: 59 | Attending: Sports Medicine | Admitting: *Deleted

## 2018-05-14 DIAGNOSIS — M546 Pain in thoracic spine: Secondary | ICD-10-CM

## 2018-05-14 DIAGNOSIS — R293 Abnormal posture: Secondary | ICD-10-CM | POA: Diagnosis present

## 2018-05-14 NOTE — Therapy (Signed)
[facility] [place], [place], [address] Phone: [phone]   Fax:  [phone]  Physical Therapy Treatment  Patient Details  Name: Thomas Wolfe MRN: [id number] Date of Birth: February 25, 1996 Referring Provider: Vickki Hearing MD.   Encounter Date: 05/14/2018  PT End of Session - 05/14/18 1605    Visit Number  11    Number of Visits  12    Date for PT Re-Evaluation  05/07/18    PT Start Time  6962    PT Stop Time  1605    PT Time Calculation (min)  50 min       Past Medical History:  Diagnosis Date  . ADHD (attention deficit hyperactivity disorder)     No past surgical history on file.  There were no vitals filed for this visit.                    [facility] Adult PT Treatment/Exercise - 05/14/18 0001      Exercises   Exercises  Shoulder      Shoulder Exercises: Standing   External Rotation  Strengthening;Both;Theraband   3 x fatigue each UE   Theraband Level (Shoulder External Rotation)  Level 2 (Red)      Shoulder Exercises: ROM/Strengthening   UBE (Upper Arm Bike)  8 minutes at 60 RPM's.      Modalities   Modalities  Electrical Stimulation;Moist Heat;Ultrasound      Moist Heat Therapy   Number Minutes Moist Heat  15 Minutes    Moist Heat Location  Shoulder      Electrical Stimulation   Electrical Stimulation Location  LT shldr.   IFC x 15 mins 80-150hz  prone    Electrical Stimulation Goals  Pain      Manual Therapy   Manual Therapy  Soft tissue mobilization    Soft tissue mobilization  STW/ IASTM  x to LT pect                    PT Long Term Goals - 05/09/18 1615      PT LONG TERM GOAL #1   Title  Independent with an HEP.    Time  4    Period  Weeks    Status  Achieved      PT LONG TERM GOAL #2   Title  perform ADL's and work activities with pain not > 2/10.    Time  4    Period  Weeks    Status  Partially Met            Plan - 05/14/18 1730    Clinical  Impression Statement  Pt arrived today doing fairly well , but felt that he had strained his LT pect and has sorenesstoday. Rx focused on RT pect after strengthening EXs. STW was performed to LT pect.  F/B modalities. Decreased pain and soreness end of session    Clinical Presentation  Evolving    Clinical Decision Making  Low    Rehab Potential  Excellent    PT Frequency  3x / week    PT Duration  4 weeks    PT Next Visit Plan  IASTM to left pect major; mid-thoracic mobs; chin tuck and cervical extension; corner stretch and scapular retratction with resistance; U/S, HMP and e'stim.  Foam roller to thoracic spine.    Consulted and Agree with Plan of Care  Patient       Patient will benefit from skilled  therapeutic intervention in order to improve the following deficits and impairments:     Visit Diagnosis: Pain in thoracic spine  Abnormal posture     Problem List Patient Active Problem List   Diagnosis Date Noted  . Attention deficit hyperactivity disorder (ADHD) 04/27/2016    [name],[name], PTA 05/14/2018, 5:39 PM  [facility] [place], [place], [address] Phone: [phone]   Fax:  [phone]  Name: Thomas Wolfe MRN: [id number] Date of Birth: 1996-08-16

## 2018-05-16 ENCOUNTER — Ambulatory Visit: Payer: 59 | Admitting: *Deleted

## 2018-05-16 DIAGNOSIS — M546 Pain in thoracic spine: Secondary | ICD-10-CM | POA: Diagnosis not present

## 2018-05-16 DIAGNOSIS — R293 Abnormal posture: Secondary | ICD-10-CM

## 2018-05-16 NOTE — Therapy (Addendum)
[facility] [place], [place], [address] Phone: [phone]   Fax:  [phone]  Physical Therapy Treatment  Patient Details  Name: Thomas Wolfe MRN: [id number] Date of Birth: 1996-09-10 Referring Provider: Vickki Hearing MD.   Encounter Date: 05/16/2018  PT End of Session - 05/16/18 1522    Visit Number  12    Number of Visits  12    Date for PT Re-Evaluation  05/07/18    PT Start Time  4709    PT Stop Time  1606    PT Time Calculation (min)  51 min       Past Medical History:  Diagnosis Date  . ADHD (attention deficit hyperactivity disorder)     No past surgical history on file.  There were no vitals filed for this visit.  Subjective Assessment - 05/16/18 1516    Subjective  Doing better overall. LT pect is still a liuttle sore 2/10 To MD  05-17-18    Patient Stated Goals  Get out of pain.    Currently in Pain?  Yes    Pain Score  1     Pain Location  Back    Pain Orientation  Right    Pain Descriptors / Indicators  Sore    Pain Onset  More than a month ago    Pain Score  2    Pain Location  Chest    Pain Orientation  Left    Pain Descriptors / Indicators  Aching;Sore    Pain Type  Acute pain    Pain Onset  More than a month ago    Pain Frequency  Intermittent                       [facility] Adult PT Treatment/Exercise - 05/16/18 0001      Exercises   Exercises  Shoulder      Shoulder Exercises: Standing   External Rotation  Strengthening;Both;Theraband    Theraband Level (Shoulder External Rotation)  Level 2 (Red)      Shoulder Exercises: ROM/Strengthening   UBE (Upper Arm Bike)  8 minutes at 60 RPM's.      Modalities   Modalities  Electrical Stimulation;Moist Heat;Ultrasound      Moist Heat Therapy   Number Minutes Moist Heat  15 Minutes    Moist Heat Location  Shoulder      Electrical Stimulation   Electrical Stimulation Location  LT shldr.   IFC x 15 mins 80-_0  prone    Electrical Stimulation Goals  Pain      Manual Therapy   Manual Therapy  Soft tissue mobilization    Soft tissue mobilization  STW/ IASTM  x to LT pect                    PT Long Term Goals - 05/16/18 1524      PT LONG TERM GOAL #1   Title  Independent with an HEP.    Period  Weeks    Status  Achieved      PT LONG TERM GOAL #2   Title  perform ADL's and work activities with pain not > 2/10.    Time  4    Period  Weeks    Status  Achieved            Plan - 05/16/18 1810    Clinical Impression Statement  Pt arrived today doing fairly well with minimal pain today  and mainly had soreness in LT pect. area. Rx focused on LT pect with therex, STW and HMP and Estim. Pt has met all LTGs at this time.    Clinical Presentation  Evolving    Clinical Decision Making  Low    Rehab Potential  Excellent    PT Frequency  3x / week    PT Duration  4 weeks    PT Treatment/Interventions  ADLs/Self Care Home Management;Cryotherapy;Electrical Stimulation;Ultrasound;Therapeutic activities;Therapeutic exercise;Patient/family education;Manual techniques;Dry needling    PT Next Visit Plan  IASTM to left pect major; mid-thoracic mobs; chin tuck and cervical extension; corner stretch and scapular retratction with resistance; U/S, HMP and e'stim.  Foam roller to thoracic spine.    Consulted and Agree with Plan of Care  Patient       Patient will benefit from skilled therapeutic intervention in order to improve the following deficits and impairments:  Decreased activity tolerance, Increased muscle spasms, Postural dysfunction, Pain  Visit Diagnosis: Pain in thoracic spine  Abnormal posture     Problem List Patient Active Problem List   Diagnosis Date Noted  . Attention deficit hyperactivity disorder (ADHD) 04/27/2016    [name],[name], PTA 05/16/2018, 6:16 PM  [facility] [address] [place], [place], [address] Phone: [phone]    Fax:  [phone]  Name: Thomas Wolfe MRN: [id number] Date of Birth: August 17, 1996  PHYSICAL THERAPY DISCHARGE SUMMARY  Visits from Start of Care: 12.  Current functional level related to goals / functional outcomes: See above.   Remaining deficits: All goals met.   Education / Equipment: HEP. Plan: Patient agrees to discharge.  Patient goals were met. Patient is being discharged due to meeting the stated rehab goals.  ?????         Mali Applegate MPT

## 2019-04-29 ENCOUNTER — Encounter (HOSPITAL_COMMUNITY): Payer: Self-pay | Admitting: Emergency Medicine

## 2019-04-29 ENCOUNTER — Emergency Department (HOSPITAL_COMMUNITY)
Admission: EM | Admit: 2019-04-29 | Discharge: 2019-04-29 | Disposition: A | Discharge status: Home / Self Care | Payer: BC Managed Care – PPO | Attending: Emergency Medicine | Admitting: Emergency Medicine

## 2019-04-29 ENCOUNTER — Other Ambulatory Visit: Payer: Self-pay

## 2019-04-29 DIAGNOSIS — Z79899 Other long term (current) drug therapy: Secondary | ICD-10-CM | POA: Insufficient documentation

## 2019-04-29 DIAGNOSIS — T43615A Adverse effect of caffeine, initial encounter: Secondary | ICD-10-CM | POA: Insufficient documentation

## 2019-04-29 DIAGNOSIS — K0889 Other specified disorders of teeth and supporting structures: Secondary | ICD-10-CM

## 2019-04-29 DIAGNOSIS — E860 Dehydration: Secondary | ICD-10-CM | POA: Diagnosis not present

## 2019-04-29 DIAGNOSIS — F17200 Nicotine dependence, unspecified, uncomplicated: Secondary | ICD-10-CM | POA: Insufficient documentation

## 2019-04-29 DIAGNOSIS — R002 Palpitations: Secondary | ICD-10-CM | POA: Diagnosis present

## 2019-04-29 LAB — COMPREHENSIVE METABOLIC PANEL
ALT: 22 U/L (ref 0–44)
AST: 22 U/L (ref 15–41)
Albumin: 4.8 g/dL (ref 3.5–5.0)
Alkaline Phosphatase: 42 U/L (ref 38–126)
Anion gap: 10 (ref 5–15)
BUN: 14 mg/dL (ref 6–20)
CO2: 26 mmol/L (ref 22–32)
Calcium: 9.6 mg/dL (ref 8.9–10.3)
Chloride: 100 mmol/L (ref 98–111)
Creatinine, Ser: 0.88 mg/dL (ref 0.61–1.24)
GFR calc Af Amer: >60 mL/min (ref >60)
GFR calc non Af Amer: >60 mL/min (ref >60)
Glucose, Bld: 129 mg/dL — ABNORMAL HIGH (ref 70–99)
Potassium: 3.5 mmol/L (ref 3.5–5.1)
Sodium: 136 mmol/L (ref 135–145)
Total Bilirubin: 2.1 mg/dL — ABNORMAL HIGH (ref 0.3–1.2)
Total Protein: 8.5 g/dL — ABNORMAL HIGH (ref 6.5–8.1)

## 2019-04-29 LAB — CBC WITH DIFFERENTIAL/PLATELET
Abs Immature Granulocytes: 0.01 10*3/uL (ref 0.00–0.07)
Basophils Absolute: 0.1 10*3/uL (ref 0.0–0.1)
Basophils Relative: 1 %
Eosinophils Absolute: 0 10*3/uL (ref 0.0–0.5)
Eosinophils Relative: 1 %
HCT: 53.8 % — ABNORMAL HIGH (ref 39.0–52.0)
Hemoglobin: 18.6 g/dL — ABNORMAL HIGH (ref 13.0–17.0)
Immature Granulocytes: 0 %
Lymphocytes Relative: 37 %
Lymphs Abs: 1.9 10*3/uL (ref 0.7–4.0)
MCH: 30.1 pg (ref 26.0–34.0)
MCHC: 34.6 g/dL (ref 30.0–36.0)
MCV: 87.2 fL (ref 80.0–100.0)
Monocytes Absolute: 0.4 10*3/uL (ref 0.1–1.0)
Monocytes Relative: 8 %
Neutro Abs: 2.8 10*3/uL (ref 1.7–7.7)
Neutrophils Relative %: 53 %
Platelets: 277 10*3/uL (ref 150–400)
RBC: 6.17 MIL/uL — ABNORMAL HIGH (ref 4.22–5.81)
RDW: 12 % (ref 11.5–15.5)
WBC: 5.3 10*3/uL (ref 4.0–10.5)
nRBC: 0 % (ref 0.0–0.2)

## 2019-04-29 LAB — RAPID URINE DRUG SCREEN, HOSP PERFORMED
Amphetamines: NOT DETECTED
Barbiturates: NOT DETECTED
Benzodiazepines: NOT DETECTED
Cocaine: NOT DETECTED
Opiates: NOT DETECTED
Tetrahydrocannabinol: NOT DETECTED

## 2019-04-29 LAB — CK: Total CK: 94 U/L (ref 49–397)

## 2019-04-29 LAB — TSH: TSH: 1.333 u[IU]/mL (ref 0.350–4.500)

## 2019-04-29 LAB — D-DIMER, QUANTITATIVE: D-Dimer, Quant: <0.27 ug/mL-FEU (ref 0.00–0.50)

## 2019-04-29 LAB — CBG MONITORING, ED: Glucose-Capillary: 127 mg/dL — ABNORMAL HIGH (ref 70–99)

## 2019-04-29 IMAGING — US US ABDOMEN LIMITED
1 series · 14 of 25 positions shown · non-contrast
Comparison: None.

CLINICAL DATA: Elevated LFTs

EXAM:
ULTRASOUND ABDOMEN LIMITED RIGHT UPPER QUADRANT

[Series 1: us abdomen limited · 0.28mm/px · 14 of 43 slices shown]
[im 1/43]
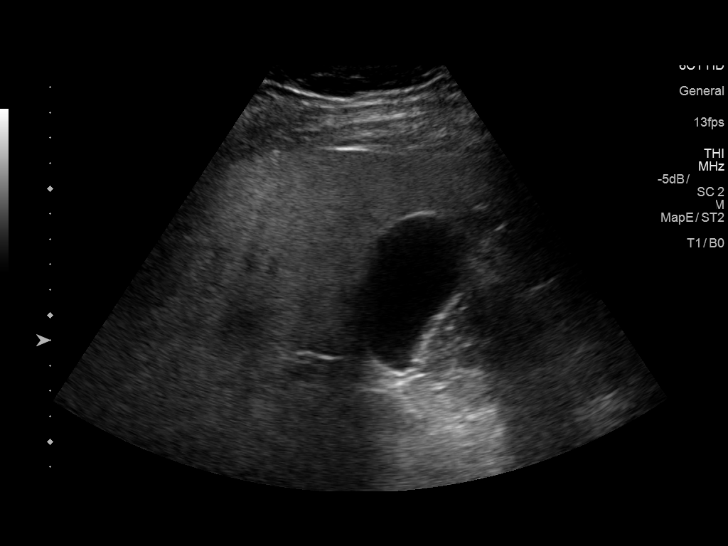
[im 4/43]
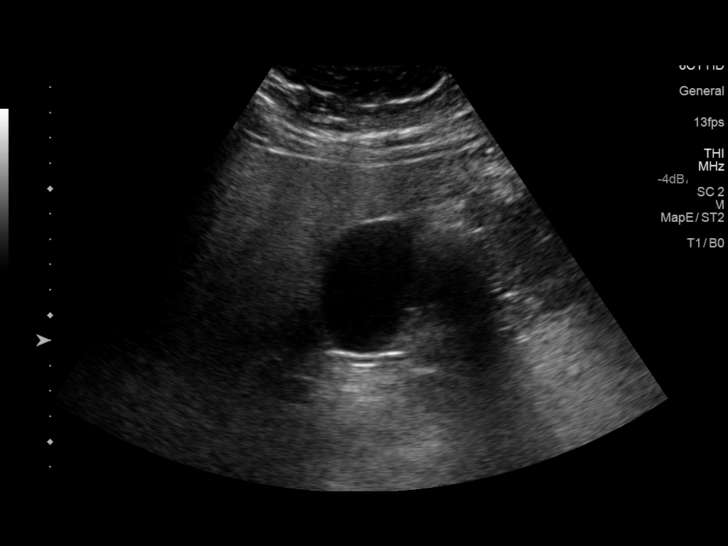
[im 8/43]
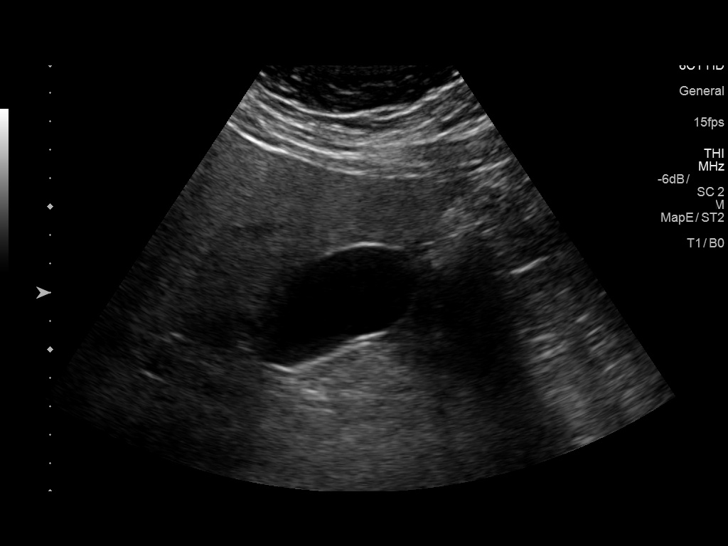
[im 11/43]
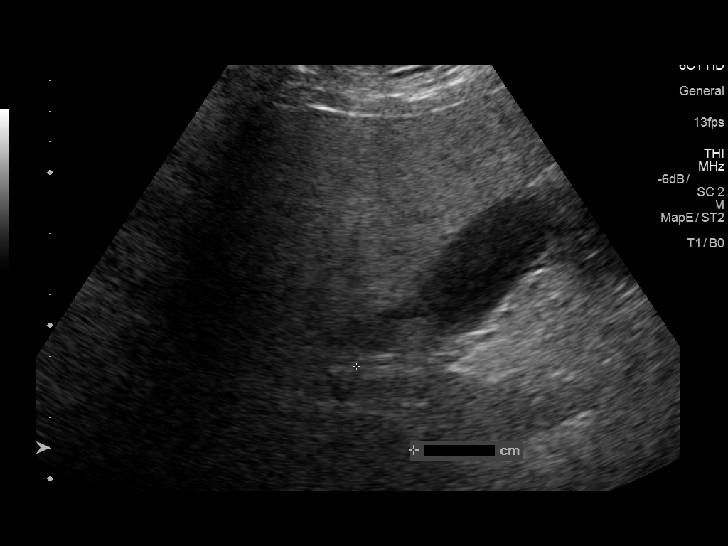
[im 15/43]
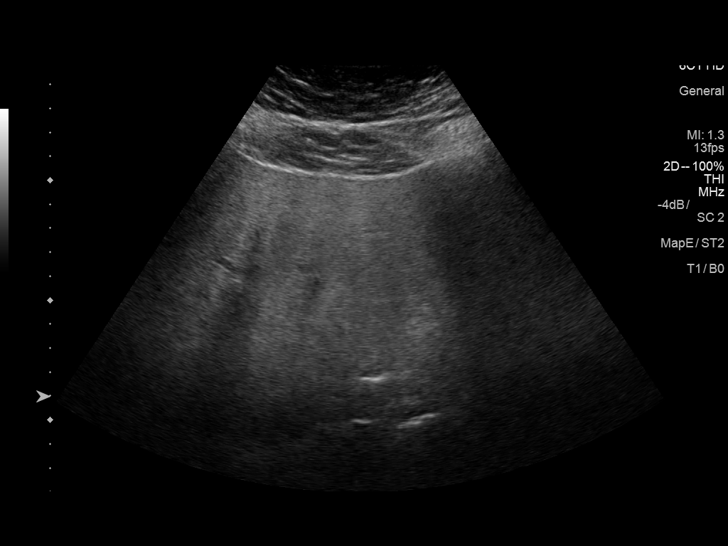
[im 16/43]
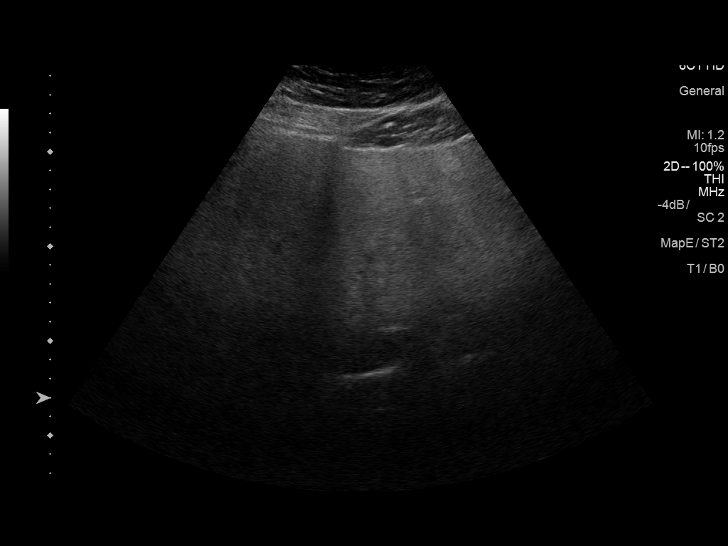
[im 20/43]
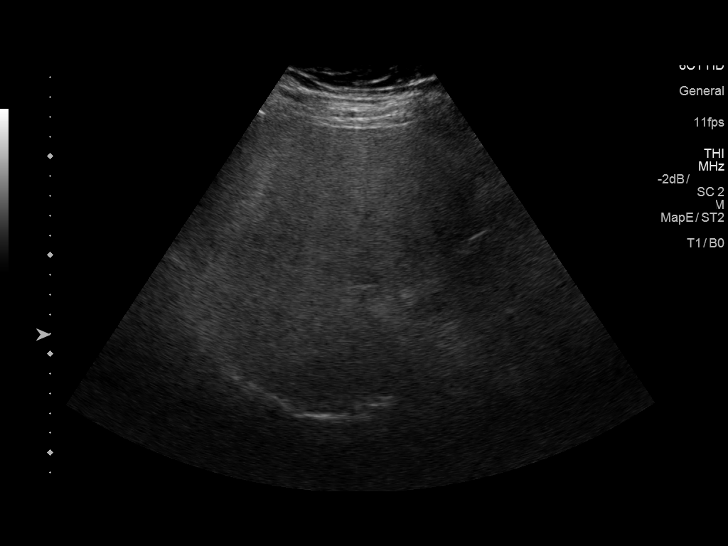
[im 23/43]
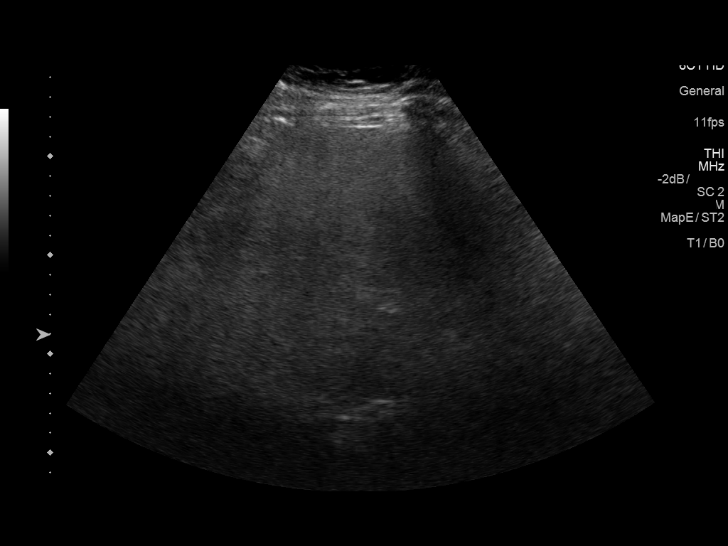
[im 27/43]
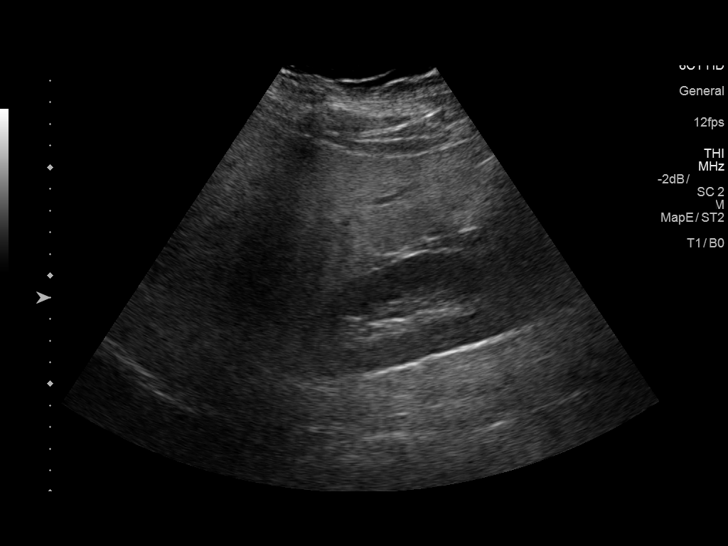
[im 29/43]
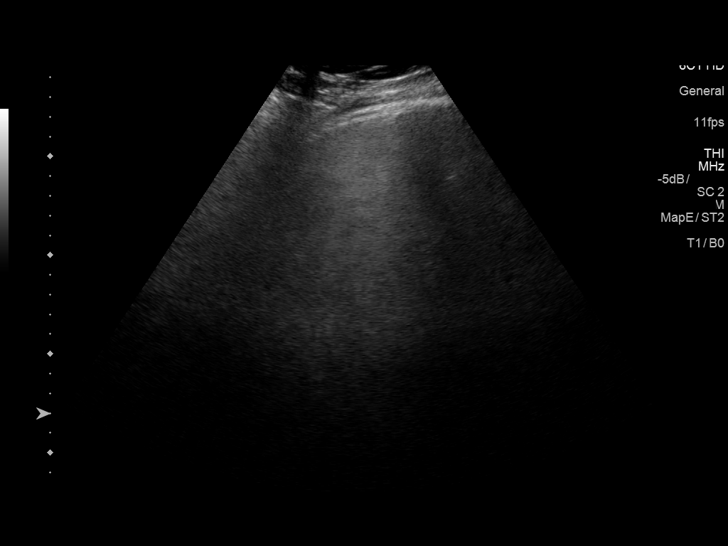
[im 32/43]
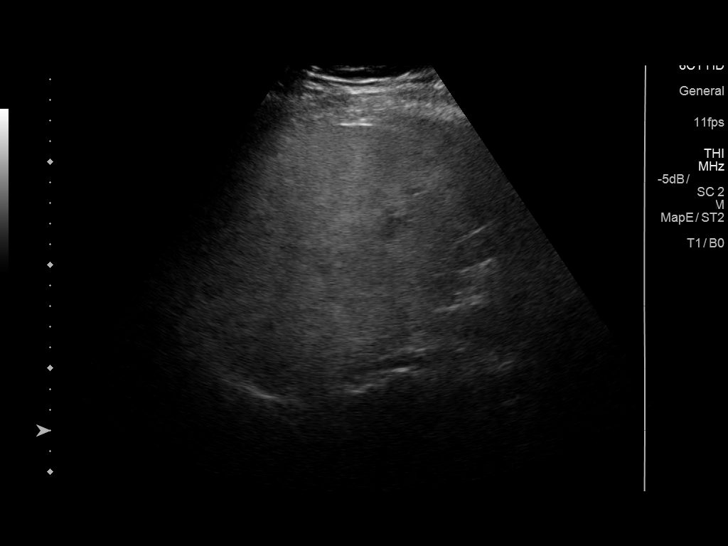
[im 36/43]
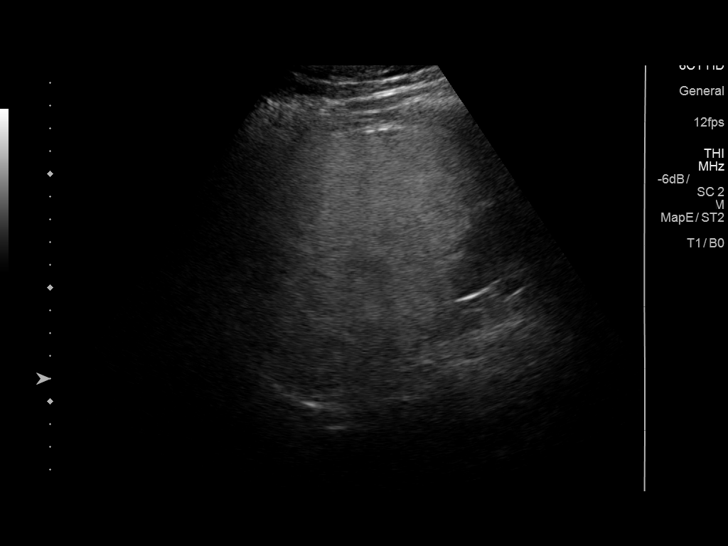
[im 39/43]
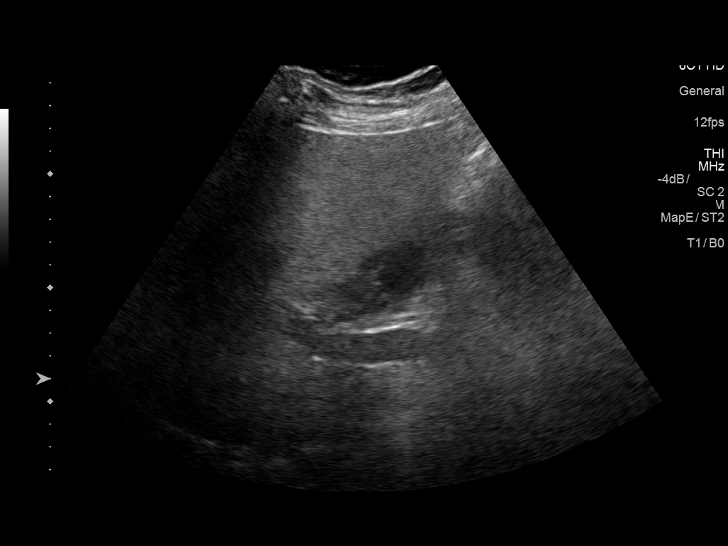
[im 43/43]
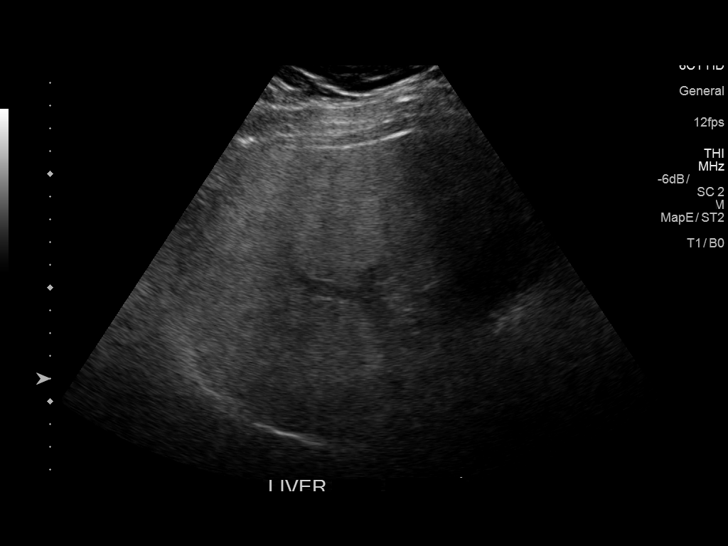

[14 of 25 positions shown; findings below may reference images not displayed]

FINDINGS: Gallbladder:

No gallstones or wall thickening visualized. No sonographic Murphy
sign noted by sonographer.

Common bile duct:

Diameter: 2.8 mm

Liver:

Diffusely increased in echogenicity with mild heterogeneity likely
related to fatty infiltration. Area of focal fatty sparing is noted
adjacent to the gallbladder. Portal vein is patent on color Doppler
imaging with normal direction of blood flow towards the liver.
IMPRESSION: Fatty liver.

## 2019-04-29 MED ORDER — SODIUM CHLORIDE 0.9 % IV BOLUS
1000.0000 mL | Freq: Once | INTRAVENOUS | Status: AC
  Administered 2019-04-29: 05:00:00 1000 mL via INTRAVENOUS

## 2019-04-29 MED ORDER — LORAZEPAM 1 MG PO TABS
1.0000 mg | ORAL_TABLET | Freq: Once | ORAL | Status: AC
  Administered 2019-04-29: 05:00:00 1 mg via ORAL
  Filled 2019-04-29: qty 1

## 2019-04-29 MED ORDER — SODIUM CHLORIDE 0.9 % IV BOLUS
1000.0000 mL | Freq: Once | INTRAVENOUS | Status: AC
  Administered 2019-04-29: 06:00:00 1000 mL via INTRAVENOUS

## 2019-04-29 MED ORDER — CLINDAMYCIN HCL 150 MG PO CAPS: 150.0000 mg | ORAL_CAPSULE | Freq: Four times a day (QID) | ORAL | 0 refills | Status: AC

## 2019-04-29 NOTE — ED Provider Notes (Signed)
  Physical Exam  BP 125/81   Pulse 98   Temp 98.8 F (37.1 C) (Oral)   Resp 19   Ht 6\' 1"  (1.854 m)   Wt 109.8 kg   SpO2 97%   BMI 31.93 kg/m   Physical Exam  ED Course/Procedures     Procedures  MDM    Received patient in signout.  And had a tachycardia. Hgb is elevated. Also has dental pain. Workup  Reassuring. Feels better. Discharge with antibiotics. Dental and PCP follow up     Davonna Belling, MD 04/29/19 [phone]

## 2019-04-29 NOTE — ED Triage Notes (Signed)
Pt states he was lying in bed at midnight and felt like he was going to pass out. Pt c/o chills, hands and feet cold, and hot flashes.

## 2019-04-29 NOTE — ED Provider Notes (Signed)
[facility] EMERGENCY DEPARTMENT Provider Note   CSN: [id number] Arrival date & time: 04/29/19  0350     History   Chief Complaint Chief Complaint  Patient presents with  . Chills    HPI Thomas Wolfe is a 23 y.o. male.     Patient states he feels like he is going to pass out.  He woke from sleep around midnight with palpitations, chills, coldness to his hands and feet and feeling like he was going to pass out.  Denies any room spinning dizziness but does feel lightheaded.  No chest pain or shortness of breath.  No nausea or vomiting.  No abdominal pain.  No fever, headache, unilateral weakness, numbness, tingling, difficulty speaking difficulty swallowing. Patient states he went to bed around 10 PM feeling normal.  He did drink 2 Mountain Dew's and coffee during the day yesterday which he does not normally do.  He denies any alcohol or drug use.  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  States some history of anxiety but does not take any medication for it.  The history is provided by the patient.    Past Medical History:  Diagnosis Date  . ADHD (attention deficit hyperactivity disorder)     Patient Active Problem List   Diagnosis Date Noted  . Attention deficit hyperactivity disorder (ADHD) 04/27/2016    History reviewed. No pertinent surgical history.      Home Medications    Prior to Admission medications   Medication Sig Start Date End Date Taking? Authorizing Provider  lisdexamfetamine (VYVANSE) 30 MG capsule Take 1 capsule (30 mg total) by mouth daily. 07/09/17   Raliegh IpGottschalk, Ashly M, DO    Family History Family History  Problem Relation Age of Onset  . Diabetes Mother     Social History Social History   Tobacco Use  . Smoking status: Current Some Day Smoker  . Smokeless tobacco: Never Used  Substance Use Topics  . Alcohol use: Yes  . Drug use: No     Allergies   Amoxicillin   Review of Systems Review of Systems   Constitutional: Positive for diaphoresis. Negative for fever.  HENT: Negative for congestion and rhinorrhea.   Respiratory: Negative for chest tightness and shortness of breath.   Cardiovascular: Negative for chest pain.  Gastrointestinal: Negative for abdominal pain, nausea and vomiting.  Genitourinary: Negative for dysuria and hematuria.  Musculoskeletal: Negative for arthralgias and myalgias.  Skin: Negative for rash.  Neurological: Positive for dizziness and light-headedness.    all other systems are negative except as noted in the HPI and PMH.    Physical Exam Updated Vital Signs BP 136/88   Pulse (!) 120   Temp 98.8 F (37.1 C) (Oral)   Resp 18   Ht 6\' 1"  (1.854 m)   Wt 109.8 kg   SpO2 100%   BMI 31.93 kg/m   Physical Exam Vitals signs and nursing note reviewed.  Constitutional:      General: He is not in acute distress.    Appearance: He is well-developed. He is diaphoretic.     Comments: Anxious appearing, diaphoretic, drinking Mountain Dew  HENT:     Head: Normocephalic and atraumatic.     Mouth/Throat:     Mouth: Mucous membranes are moist.     Pharynx: No oropharyngeal exudate.     Comments: Chipped L lower second molar. Floor of mouth soft. No abscess Eyes:     Conjunctiva/sclera: Conjunctivae normal.  Pupils: Pupils are equal, round, and reactive to light.  Neck:     Musculoskeletal: Normal range of motion and neck supple.     Comments: No meningismus. Cardiovascular:     Rate and Rhythm: Regular rhythm. Tachycardia present.     Heart sounds: Normal heart sounds. No murmur.     Comments: Tachycardic 130s Pulmonary:     Effort: Pulmonary effort is normal. No respiratory distress.     Breath sounds: Normal breath sounds.  Abdominal:     Palpations: Abdomen is soft.     Tenderness: There is no abdominal tenderness. There is no guarding or rebound.  Musculoskeletal: Normal range of motion.        General: No tenderness.  Skin:    General: Skin is  warm.     Capillary Refill: Capillary refill takes less than 2 seconds.  Neurological:     General: No focal deficit present.     Mental Status: He is alert and oriented to person, place, and time. Mental status is at baseline.     Cranial Nerves: No cranial nerve deficit.     Motor: No abnormal muscle tone.     Coordination: Coordination normal.     Comments: No ataxia on finger to nose bilaterally. No pronator drift. 5/5 strength throughout. CN 2-12 intact.Equal grip strength. Sensation intact.   No clonus. No intention tremor  Psychiatric:        Behavior: Behavior normal.      ED Treatments / Results  Labs (all labs ordered are listed, but only abnormal results are displayed) Labs Reviewed  CBC WITH DIFFERENTIAL/PLATELET - Abnormal; Notable for the following components:      Result Value   RBC 6.17 (*)    Hemoglobin 18.6 (*)    HCT 53.8 (*)    All other components within normal limits  COMPREHENSIVE METABOLIC PANEL - Abnormal; Notable for the following components:   Glucose, Bld 129 (*)    Total Protein 8.5 (*)    Total Bilirubin 2.1 (*)    All other components within normal limits  CBG MONITORING, ED - Abnormal; Notable for the following components:   Glucose-Capillary 127 (*)    All other components within normal limits  TSH  RAPID URINE DRUG SCREEN, HOSP PERFORMED  CK    EKG EKG Interpretation  Date/Time:  Tuesday April 29 2019 04:50:47 EDT Ventricular Rate:  107 PR Interval:    QRS Duration: 93 QT Interval:  317 QTC Calculation: 423 R Axis:   97 Text Interpretation:  Sinus tachycardia Borderline right axis deviation No previous ECGs available Confirmed by Glynn Octaveancour, Shanavia Makela [phone] on 04/29/2019 6:09:16 AM   Radiology No results found.  Procedures Procedures (including critical care time)  Medications Ordered in ED Medications  LORazepam (ATIVAN) tablet 1 mg (has no administration in time range)  sodium chloride 0.9 % bolus 1,000 mL (has no  administration in time range)     Initial Impression / Assessment and Plan / ED Course  I have reviewed the triage vital signs and the nursing notes.  Pertinent labs & imaging results that were available during my care of the patient were reviewed by me and considered in my medical decision making (see chart for details).       Palpitations, near syncope, chills, lightheadedness.  Patient anxious and diaphoretic and tachycardic.  Suspect majority of symptoms due to excessive caffeine intake throughout the day yesterday.  Does have some component of anxiety as well.  We will hydrate,  check EKG, basic labs including TSH. P.o. Ativan.  Labs show hemoconcentration.  Normal LFTs other than elevated bilirubin 2.1.  Patient states history of "fatty liver" and his bilirubin has been elevated in the past but he does not know the number.  Denies any abdominal pain, fever or vomiting.  CK and kidney function are normal.  TSH is normal.  Patient feeling improved.  Diaphoresis has resolved, tachycardia has resolved and blood pressures improved. Suspect combination of dehydration, caffeine intoxication and anxiety causing his presentation.   Discussed with patient increase oral hydration at home, decrease caffeine use and establish care with a PCP.  Patient also states he was having dental pain several days ago but not any now.  He wonders if that could be contributing to his presentation as well. No evidence of abscess or ludwig's angina.  Return precautions discussed.   BP 125/81   Pulse 98   Temp 98.8 F (37.1 C) (Oral)   Resp 19   Ht 6\' 1"  (1.854 m)   Wt 109.8 kg   SpO2 97%   BMI 31.93 kg/m    Final Clinical Impressions(s) / ED Diagnoses   Final diagnoses:  Dehydration  Adverse effect of caffeine, initial encounter  Pain, dental    ED Discharge Orders    None       Lasonia Casino, Annie Main, MD 04/29/19 [phone]

## 2019-04-29 NOTE — Discharge Instructions (Addendum)
Keep yourself hydrated.  Reduce your caffeine intake.  Follow-up with your doctor.  Return to the ED if you develop chest pain, shortness of breath, any other concerns.  Follow-up with your dentist as planned for the dental pain.  Your hemoglobin is also high today.  Could be due to dehydration but needs to be followed.

## 2019-04-29 NOTE — ED Notes (Signed)
Pt ambulatory to restroom with no distress or difficulty noted at this time.

## 2019-05-12 ENCOUNTER — Emergency Department (HOSPITAL_COMMUNITY)
Admission: EM | Admit: 2019-05-12 | Discharge: 2019-05-12 | Disposition: A | Discharge status: Home / Self Care | Payer: BC Managed Care – PPO | Attending: Emergency Medicine | Admitting: Emergency Medicine

## 2019-05-12 ENCOUNTER — Encounter (HOSPITAL_COMMUNITY): Payer: Self-pay | Admitting: Emergency Medicine

## 2019-05-12 ENCOUNTER — Other Ambulatory Visit: Payer: Self-pay

## 2019-05-12 DIAGNOSIS — F17210 Nicotine dependence, cigarettes, uncomplicated: Secondary | ICD-10-CM | POA: Diagnosis not present

## 2019-05-12 DIAGNOSIS — Z79899 Other long term (current) drug therapy: Secondary | ICD-10-CM | POA: Diagnosis not present

## 2019-05-12 DIAGNOSIS — F419 Anxiety disorder, unspecified: Secondary | ICD-10-CM | POA: Diagnosis not present

## 2019-05-12 DIAGNOSIS — R251 Tremor, unspecified: Secondary | ICD-10-CM | POA: Diagnosis present

## 2019-05-12 NOTE — ED Triage Notes (Signed)
Pt C/O "shaking" and nausea. Pt states he was seen for the same 2 weeks ago. Denies pain.

## 2019-05-12 NOTE — ED Provider Notes (Signed)
[facility] Provider Note   CSN: [id number] Arrival date & time: 05/12/19  0116   Time seen 2:10 AM History   Chief Complaint Chief Complaint  Patient presents with  . Shaking    HPI Thomas Wolfe is a 23 y.o. male.     HPI patient states he started shaking around midnight while lying in bed.  He states he started having heat flashes at the same time.  He states he feels like his face gets hot.  He states he is nauseated but he denies feeling like he needs to vomit, he thought nauseated Min[place] lightheaded.  He states his eyes are "going in and out" meaning his vision.  He states that lasted about 10 to 15 minutes.  He states he felt dizzy and lightheaded for about 10 minutes.  He states he felt like he was going to pass out.  He denies headache, chest pain, abdominal pain, sore throat, cough, or rhinorrhea.  He states he does feel anxious.  He denies having any reason to be anxious.  He states he does work in [employer] but he works in a [employer] and he does not sweat a lot.  He states since his last visit to the ED has greatly cut out on his caffeine ingestion.  He states these episodes mainly seem to happen at night when it is quiet and dark.  He denies feeling depressed.  Patient drove himself to the ED.  [facility]   Past Medical History:  Diagnosis Date  . ADHD (attention deficit hyperactivity disorder)     Patient Active Problem List   Diagnosis Date Noted  . Attention deficit hyperactivity disorder (ADHD) 04/27/2016    History reviewed. No pertinent surgical history.      Home Medications    Prior to Admission medications   Medication Sig Start Date End Date Taking? Authorizing Provider  lisdexamfetamine (VYVANSE) 30 MG capsule Take 1 capsule (30 mg total) by mouth daily. 07/09/17   Janora Norlander, DO    Family History Family History  Problem Relation Age of Onset  .  Diabetes Mother     Social History Social History   Tobacco Use  . Smoking status: Current Some Day Smoker  . Smokeless tobacco: Never Used  Substance Use Topics  . Alcohol use: Yes  . Drug use: No     Allergies   Amoxicillin   Review of Systems Review of Systems  All other systems reviewed and are negative.    Physical Exam Updated Vital Signs BP (!) 145/77   Pulse 90   Temp 97.9 F (36.6 C)   Resp 18   Ht _0  (1.854 m)   Wt 109.8 kg   SpO2 100%   BMI 31.94 kg/m   Physical Exam Vitals signs and nursing note reviewed.  Constitutional:      General: He is not in acute distress.    Appearance: Normal appearance. He is well-developed. He is not ill-appearing or toxic-appearing.     Comments: When I entered the room patient states he is shaking however I do not observe any shaking.  At the end of our conversation he did have some twitching of his legs.  HENT:     Head: Normocephalic and atraumatic.     Right Ear: External ear normal.     Left Ear: External ear normal.     Nose: Nose normal. No mucosal edema or rhinorrhea.  Mouth/Throat:     Dentition: No dental abscesses.     Pharynx: No uvula swelling.  Eyes:     Extraocular Movements: Extraocular movements intact.     Conjunctiva/sclera: Conjunctivae normal.     Pupils: Pupils are equal, round, and reactive to light.  Neck:     Musculoskeletal: Full passive range of motion without pain, normal range of motion and neck supple.  Cardiovascular:     Rate and Rhythm: Normal rate and regular rhythm.     Heart sounds: Normal heart sounds. No murmur. No friction rub. No gallop.   Pulmonary:     Effort: Pulmonary effort is normal. No respiratory distress.     Breath sounds: Normal breath sounds. No wheezing, rhonchi or rales.  Chest:     Chest wall: No tenderness or crepitus.  Musculoskeletal: Normal range of motion.        General: No tenderness.     Comments: Moves all extremities well.   Skin:     General: Skin is warm and dry.     Coloration: Skin is not pale.     Findings: No erythema or rash.  Neurological:     Mental Status: He is alert and oriented to person, place, and time.     Cranial Nerves: No cranial nerve deficit.  Psychiatric:        Mood and Affect: Mood is anxious.        Speech: Speech is rapid and pressured.        Behavior: Behavior normal.      ED Treatments / Results  Labs (all labs ordered are listed, but only abnormal results are displayed) Labs Reviewed - No data to display  EKG None  Radiology No results found.  Procedures Procedures (including critical care time)  Medications Ordered in ED Medications - No data to display   Initial Impression / Assessment and Plan / ED Course  I have reviewed the triage vital signs and the nursing notes.  Pertinent labs & imaging results that were available during my care of the patient were reviewed by me and considered in my medical decision making (see chart for details).    Patient had a thorough evaluation his last visit including a d-dimer, TSH, UDS, c-Met, and CBC.  The biggest abnormality I could see was on his CBC and his hemoglobin was 18.6.  He smokes about a third a pack a day.  This was consistent with his diagnosis of dehydration.  Patient is not nauseated or having vomiting.  At this point I feel like he can orally hydrate himself.  He also drove himself to the ED so I did not feel comfortable giving him anything for his anxiety.  He is amenable to being evaluated for his anxiety.  He was advised to stop on the way home and get a bottle of Gatorade and drink 1 tonight and 1 tomorrow.  I do not suspect he has hyperviscosity syndrome or polycythemia  Final Clinical Impressions(s) / ED Diagnoses   Final diagnoses:  Anxiety    ED Discharge Orders    None     Plan discharge  Rolland Porter, MD, Barbette Or, MD 05/12/19 [phone]

## 2019-05-12 NOTE — ED Notes (Signed)
Pt ambulatory to [facility]

## 2019-05-12 NOTE — Discharge Instructions (Signed)
Stop at [place] on your way home and get a bottle of a sports drink and drink it tonight and another 1 to drink tomorrow.  Consider calling one of the mental health clinics to be evaluated for your anxiety.

## 2019-05-14 ENCOUNTER — Other Ambulatory Visit: Payer: Self-pay

## 2019-05-14 ENCOUNTER — Emergency Department (HOSPITAL_COMMUNITY): Payer: BC Managed Care – PPO

## 2019-05-14 ENCOUNTER — Emergency Department (HOSPITAL_COMMUNITY)
Admission: EM | Admit: 2019-05-14 | Discharge: 2019-05-14 | Disposition: A | Discharge status: Home / Self Care | Payer: BC Managed Care – PPO | Attending: Emergency Medicine | Admitting: Emergency Medicine

## 2019-05-14 ENCOUNTER — Encounter (HOSPITAL_COMMUNITY): Payer: Self-pay

## 2019-05-14 DIAGNOSIS — R000 Tachycardia, unspecified: Secondary | ICD-10-CM | POA: Diagnosis not present

## 2019-05-14 DIAGNOSIS — R002 Palpitations: Secondary | ICD-10-CM | POA: Diagnosis present

## 2019-05-14 DIAGNOSIS — F17210 Nicotine dependence, cigarettes, uncomplicated: Secondary | ICD-10-CM | POA: Insufficient documentation

## 2019-05-14 DIAGNOSIS — J029 Acute pharyngitis, unspecified: Secondary | ICD-10-CM | POA: Diagnosis not present

## 2019-05-14 DIAGNOSIS — Z79899 Other long term (current) drug therapy: Secondary | ICD-10-CM | POA: Diagnosis not present

## 2019-05-14 LAB — CBC
HCT: 52.6 % — ABNORMAL HIGH (ref 39.0–52.0)
Hemoglobin: 17.5 g/dL — ABNORMAL HIGH (ref 13.0–17.0)
MCH: 29.9 pg (ref 26.0–34.0)
MCHC: 33.3 g/dL (ref 30.0–36.0)
MCV: 89.9 fL (ref 80.0–100.0)
Platelets: 241 10*3/uL (ref 150–400)
RBC: 5.85 MIL/uL — ABNORMAL HIGH (ref 4.22–5.81)
RDW: 12.2 % (ref 11.5–15.5)
WBC: 8.4 10*3/uL (ref 4.0–10.5)
nRBC: 0 % (ref 0.0–0.2)

## 2019-05-14 LAB — BASIC METABOLIC PANEL
Anion gap: 11 (ref 5–15)
BUN: 19 mg/dL (ref 6–20)
CO2: 24 mmol/L (ref 22–32)
Calcium: 9.1 mg/dL (ref 8.9–10.3)
Chloride: 103 mmol/L (ref 98–111)
Creatinine, Ser: 0.87 mg/dL (ref 0.61–1.24)
GFR calc Af Amer: >60 mL/min (ref >60)
GFR calc non Af Amer: >60 mL/min (ref >60)
Glucose, Bld: 117 mg/dL — ABNORMAL HIGH (ref 70–99)
Potassium: 3.3 mmol/L — ABNORMAL LOW (ref 3.5–5.1)
Sodium: 138 mmol/L (ref 135–145)

## 2019-05-14 MED ORDER — SODIUM CHLORIDE 0.9 % IV BOLUS
1000.0000 mL | Freq: Once | INTRAVENOUS | Status: AC
  Administered 2019-05-14: 1000 mL via INTRAVENOUS

## 2019-05-14 MED ORDER — POTASSIUM CHLORIDE CRYS ER 20 MEQ PO TBCR
20.0000 meq | EXTENDED_RELEASE_TABLET | Freq: Once | ORAL | Status: AC
  Administered 2019-05-14: 23:00:00 20 meq via ORAL
  Filled 2019-05-14: qty 1

## 2019-05-14 NOTE — Discharge Instructions (Addendum)
You were seen in the emergency department for feeling her heart racing.  Your heart rate was elevated here but improved with some IV fluids and rest.  It will be important for you to continue your regular medications and follow-up with your doctor.  Please limit any caffeine or other stimulants.  Return to the emergency department if any concerns.

## 2019-05-14 NOTE — ED Provider Notes (Signed)
[facility] EMERGENCY DEPARTMENT Provider Note   CSN: [id number] Arrival date & time: 05/14/19  1924     History   Chief Complaint Chief Complaint  Patient presents with  . Palpitations    HPI Thomas Wolfe is a 23 y.o. male.  This is his third visit over the past 3 to 4 weeks for elevated heart rate and feeling anxious.  He said he is always anxious and he does not feel more anxious but he started with his heart racing at about 6 PM.  He is newly started hydroxyzine and a blood pressure medicine today after seeing his primary care doctor.  He arrives here today with a heart rate in the 120s.  He said he did not feel any improvement with the anxiety medication.     The history is provided by the patient.  Palpitations Palpitations quality:  Fast Onset quality:  Sudden Duration:  3 hours Timing:  Constant Progression:  Unchanged Chronicity:  Recurrent Context: anxiety   Context: not illicit drugs   Relieved by:  Nothing Worsened by:  Nothing Ineffective treatments:  None tried Associated symptoms: no back pain, no chest pain, no chest pressure, no cough, no diaphoresis, no hemoptysis, no nausea, no numbness, no shortness of breath, no syncope, no vomiting and no weakness   Risk factors: stress   Risk factors: no diabetes mellitus, no hx of PE and no hyperthyroidism     Past Medical History:  Diagnosis Date  . ADHD (attention deficit hyperactivity disorder)     Patient Active Problem List   Diagnosis Date Noted  . Attention deficit hyperactivity disorder (ADHD) 04/27/2016    History reviewed. No pertinent surgical history.      Home Medications    Prior to Admission medications   Medication Sig Start Date End Date Taking? Authorizing Provider  escitalopram (LEXAPRO) 10 MG tablet Take by mouth. 05/14/19 08/12/19 Yes [provider]  hydrOXYzine (ATARAX/VISTARIL) 25 MG tablet Take by mouth. 05/14/19  Yes [provider]  ipratropium (ATROVENT)  0.06 % nasal spray 2 sprays by Each Nare route Three (3) times a day. 09/24/18 09/24/19 Yes [provider]  lisinopril (ZESTRIL) 10 MG tablet Take by mouth. 05/14/19  Yes [provider]  buPROPion (WELLBUTRIN XL) 150 MG 24 hr tablet TAKE 1 TABLET BY MOUTH ONCE DAILY IN THE MORNING 04/23/19   [provider]  diclofenac sodium (VOLTAREN) 1 % GEL diclofenac 1 % topical gel  APPLY 2 GRAMS TOPICALLY FOUR (4) TIMES A DAY.    [provider]  lisdexamfetamine (VYVANSE) 30 MG capsule Take 1 capsule (30 mg total) by mouth daily. 07/09/17   Janora Norlander, DO  phentermine (ADIPEX-P) 37.5 MG tablet TAKE 1 TABLET BY MOUTH ONCE DAILY BEFORE BREAKFAST 04/23/19   [provider]    Family History Family History  Problem Relation Age of Onset  . Diabetes Mother     Social History Social History   Tobacco Use  . Smoking status: Current Some Day Smoker  . Smokeless tobacco: Never Used  Substance Use Topics  . Alcohol use: Yes  . Drug use: No     Allergies   Amoxicillin   Review of Systems Review of Systems  Constitutional: Negative for diaphoresis and fever.  HENT: Positive for sore throat (minimal).   Eyes: Negative for visual disturbance.  Respiratory: Negative for cough, hemoptysis and shortness of breath.   Cardiovascular: Positive for palpitations. Negative for chest pain and syncope.  Gastrointestinal: Negative  for abdominal pain, nausea and vomiting.  Genitourinary: Negative for dysuria.  Musculoskeletal: Negative for back pain.  Skin: Negative for rash.  Neurological: Negative for weakness and numbness.     Physical Exam Updated Vital Signs BP (!) 124/105 (BP Location: Right Arm)   Pulse (!) 123   Temp 97.6 F (36.4 C)   Resp 18   SpO2 100%   Physical Exam Vitals signs and nursing note reviewed.  Constitutional:      Appearance: He is well-developed.  HENT:     Head: Normocephalic and atraumatic.  Eyes:      Conjunctiva/sclera: Conjunctivae normal.  Neck:     Musculoskeletal: Neck supple.  Cardiovascular:     Rate and Rhythm: Regular rhythm. Tachycardia present.     Heart sounds: No murmur.  Pulmonary:     Effort: Pulmonary effort is normal. No respiratory distress.     Breath sounds: Normal breath sounds.  Abdominal:     Palpations: Abdomen is soft.     Tenderness: There is no abdominal tenderness.  Musculoskeletal: Normal range of motion.     Right lower leg: No edema.     Left lower leg: No edema.  Skin:    General: Skin is warm and dry.     Capillary Refill: Capillary refill takes less than 2 seconds.  Neurological:     General: No focal deficit present.     Mental Status: He is alert and oriented to person, place, and time.      ED Treatments / Results  Labs (all labs ordered are listed, but only abnormal results are displayed) Labs Reviewed  BASIC METABOLIC PANEL - Abnormal; Notable for the following components:      Result Value   Potassium 3.3 (*)    Glucose, Bld 117 (*)    All other components within normal limits  CBC - Abnormal; Notable for the following components:   RBC 5.85 (*)    Hemoglobin 17.5 (*)    HCT 52.6 (*)    All other components within normal limits    EKG None  Radiology Dg Chest 2 View  Result Date: 05/14/2019 CLINICAL DATA:  Chest pain EXAM: CHEST - 2 VIEW COMPARISON:  None. FINDINGS: The heart size and mediastinal contours are within normal limits. Both lungs are clear. The visualized skeletal structures are unremarkable. IMPRESSION: No active cardiopulmonary disease. Electronically Signed   By: Katherine Mantlehristopher  Green M.D.   On: 05/14/2019 20:18    Procedures Procedures (including critical care time)  Medications Ordered in ED Medications - No data to display   Initial Impression / Assessment and Plan / ED Course  I have reviewed the triage vital signs and the nursing notes.  Pertinent labs & imaging results that were available during my  care of the patient were reviewed by me and considered in my medical decision making (see chart for details).  Clinical Course as of May 14 940  Wed May 14, 2019  2113 Patient here with palpitations tachycardia anxiety.  His labs show mildly low potassium at 3.3.  His hemoglobin is elevated but at his baseline.He is satting 100% and not tachypneic.  Afebrile.   [MB]  2114 Differential diagnosis includes anxiety, arrhythmia, dehydration,Metabolic derangement   [MB]  2146 EKG is sinus tachycardia rate of 130 normal intervals right axis deviation similar to prior EKG 8/20   [MB]  2249 Patient received a liter of fluid.  Heart rate now in the 90s.  We will give some oral potassium  and discharge and have him follow-up with his PCP.   [MB]    Clinical Course User Index [MB] Terrilee Files, MD         Final Clinical Impressions(s) / ED Diagnoses   Final diagnoses:  Palpitations  Sinus tachycardia    ED Discharge Orders    None       Terrilee Files, MD 05/15/19 [phone]

## 2019-05-14 NOTE — ED Triage Notes (Signed)
Pt c/o feeling like his heart is racing. Tried to take anxiety medication , they started him on a new one today. And it did not help.

## 2019-05-20 ENCOUNTER — Encounter (HOSPITAL_COMMUNITY): Payer: Self-pay | Admitting: Family Medicine

## 2019-05-20 DIAGNOSIS — Z79899 Other long term (current) drug therapy: Secondary | ICD-10-CM | POA: Diagnosis not present

## 2019-05-20 DIAGNOSIS — H53149 Visual discomfort, unspecified: Secondary | ICD-10-CM | POA: Diagnosis not present

## 2019-05-20 DIAGNOSIS — H538 Other visual disturbances: Secondary | ICD-10-CM | POA: Diagnosis not present

## 2019-05-20 DIAGNOSIS — R51 Headache: Secondary | ICD-10-CM | POA: Diagnosis not present

## 2019-05-20 DIAGNOSIS — Z87891 Personal history of nicotine dependence: Secondary | ICD-10-CM | POA: Insufficient documentation

## 2019-05-20 NOTE — ED Triage Notes (Signed)
Patient is complaining of left sided headache with nausea. Patient states he woke up with the headache. Denies taking any medication for symptoms.

## 2019-05-21 ENCOUNTER — Emergency Department (HOSPITAL_COMMUNITY)
Admission: EM | Admit: 2019-05-21 | Discharge: 2019-05-21 | Disposition: A | Discharge status: Home / Self Care | Payer: BC Managed Care – PPO | Attending: Emergency Medicine | Admitting: Emergency Medicine

## 2019-05-21 ENCOUNTER — Emergency Department (HOSPITAL_COMMUNITY): Payer: BC Managed Care – PPO

## 2019-05-21 DIAGNOSIS — R519 Headache, unspecified: Secondary | ICD-10-CM

## 2019-05-21 MED ORDER — METOCLOPRAMIDE HCL 5 MG/ML IJ SOLN
10.0000 mg | Freq: Once | INTRAMUSCULAR | Status: AC
  Administered 2019-05-21: 10 mg via INTRAVENOUS
  Filled 2019-05-21: qty 2

## 2019-05-21 MED ORDER — DIPHENHYDRAMINE HCL 50 MG/ML IJ SOLN
25.0000 mg | Freq: Once | INTRAMUSCULAR | Status: AC
  Administered 2019-05-21: 25 mg via INTRAVENOUS
  Filled 2019-05-21: qty 1

## 2019-05-21 MED ORDER — KETOROLAC TROMETHAMINE 15 MG/ML IJ SOLN
15.0000 mg | Freq: Once | INTRAMUSCULAR | Status: AC
  Administered 2019-05-21: 03:00:00 15 mg via INTRAVENOUS
  Filled 2019-05-21: qty 1

## 2019-05-21 NOTE — ED Provider Notes (Signed)
[facility]-EMERGENCY DEPT Provider Note: Lowella DellJ. Lane Sayf Kerner, MD, FACEP  CSN: [id number] MRN: [id number] ARRIVAL: 05/20/19 at 2155 ROOM: RESB/RESB   CHIEF COMPLAINT  Headache   HISTORY OF PRESENT ILLNESS  05/21/19 2:27 AM Thomas Wolfe is a 23 y.o. male without history of significant headaches.  He is here with a headache that was present when he awoke yesterday morning.  The headache is located on the left side of the head in the parietotemporal area.  It is throbbing in nature.  There is also a pressure component in the forehead bilaterally.  He rates his pain as a 6 out of 10.  He has had intermittent blurred vision and photophobia but no nausea or vomiting.  He denies any numbness or weakness.  He has a baseline stutter which has not changed.  He did take hydroxyzine yesterday afternoon for anxiety but has not taken any analgesics.   Past Medical History:  Diagnosis Date  . ADHD (attention deficit hyperactivity disorder)     History reviewed. No pertinent surgical history.  Family History  Problem Relation Age of Onset  . Diabetes Mother     Social History   Tobacco Use  . Smoking status: Former Games developermoker  . Smokeless tobacco: Never Used  Substance Use Topics  . Alcohol use: Not Currently  . Drug use: No    Prior to Admission medications   Medication Sig Start Date End Date Taking? Authorizing Provider  escitalopram (LEXAPRO) 10 MG tablet Take 10 mg by mouth daily.  05/14/19 08/12/19  [provider]  hydrOXYzine (ATARAX/VISTARIL) 25 MG tablet Take 25 mg by mouth 3 (three) times daily as needed for anxiety.  05/14/19   [provider]  lisinopril (ZESTRIL) 10 MG tablet Take 10 mg by mouth daily.  05/14/19   [provider]  Multiple Vitamin (MULTIVITAMIN WITH MINERALS) TABS tablet Take 1 tablet by mouth daily.    [provider]    Allergies Amoxicillin   REVIEW OF SYSTEMS  Negative except as noted here or in the History of Present Illness.   PHYSICAL EXAMINATION  Initial Vital Signs Blood pressure 127/77, pulse 72, temperature 98.2 F (36.8 C), temperature source Oral, resp. rate 16, height 6\' 1"  (1.854 m), weight 89 kg, SpO2 95 %.  Examination General: Well-developed, well-nourished male in no acute distress; appearance consistent with age of record HENT: normocephalic; atraumatic Eyes: pupils equal, round and reactive to light; extraocular muscles intact Neck: supple Heart: regular rate and rhythm Lungs: clear to auscultation bilaterally Abdomen: soft; nondistended; nontender; no masses or hepatosplenomegaly; bowel sounds present Extremities: No deformity; full range of motion; pulses normal Neurologic: Awake, alert and oriented; motor function intact in all extremities and symmetric; no facial droop; no pronator drift; normal finger-to-nose Skin: Warm and dry Psychiatric: Normal mood and affect   RESULTS  Summary of this visit's results, reviewed by myself:   EKG Interpretation  Date/Time:    Ventricular Rate:    PR Interval:    QRS Duration:   QT Interval:    QTC Calculation:   R Axis:     Text Interpretation:        Laboratory Studies: No results found for this or any previous visit (from the past 24 hour(s)). Imaging Studies: Ct Head Wo Contrast  Result Date: 05/21/2019 CLINICAL DATA:  Headache EXAM: CT HEAD WITHOUT CONTRAST TECHNIQUE: Contiguous axial images were obtained from the base of the skull through the vertex without intravenous contrast. COMPARISON:  None. FINDINGS: Brain: No  acute intracranial abnormality. Specifically, no hemorrhage, hydrocephalus, mass lesion, acute infarction, or significant intracranial injury. Vascular: No hyperdense vessel or unexpected calcification. Skull: No acute calvarial abnormality. Sinuses/Orbits: Visualized paranasal sinuses and mastoids clear. Orbital soft tissues unremarkable. Other: None IMPRESSION: Normal study. Electronically Signed   By: Rolm Baptise M.D.    On: 05/21/2019 03:20    ED COURSE and MDM  Nursing notes and initial vitals signs, including pulse oximetry, reviewed.  Vitals:   05/20/19 2227 05/21/19 0255 05/21/19 0311  BP: 127/77 123/87 125/74  Pulse: 72 75 85  Resp: 16 16 15   Temp: 98.2 F (36.8 C)    TempSrc: Oral    SpO2: 95% 100% 99%  Weight: 89 kg    Height: 6\' 1"  (1.854 m)     3:43 AM Patient feeling better after IV medications.  CT scan reassuring.  Symptoms are suggestive of a migraine although he has no personal or family history of migraines.  PROCEDURES    ED DIAGNOSES     ICD-10-CM   1. Bad headache  R51        Lizanne Erker, MD 05/21/19 [phone]

## 2019-05-21 NOTE — ED Notes (Signed)
Pt ambulated to the bathroom without assistance. Gait steady  

## 2019-05-21 NOTE — ED Notes (Signed)
Patient transported to CT 

## 2019-05-21 NOTE — ED Notes (Signed)
Pt verbalized discharge instructions and follow up care. Alert and ambulatory. No IV. No further questions at this time. Has a ride home.  

## 2019-09-24 ENCOUNTER — Emergency Department (HOSPITAL_COMMUNITY)
Admission: EM | Admit: 2019-09-24 | Discharge: 2019-09-24 | Disposition: A | Discharge status: Home / Self Care | Payer: BC Managed Care – PPO | Attending: Emergency Medicine | Admitting: Emergency Medicine

## 2019-09-24 ENCOUNTER — Encounter (HOSPITAL_COMMUNITY): Payer: Self-pay | Admitting: Emergency Medicine

## 2019-09-24 ENCOUNTER — Other Ambulatory Visit: Payer: Self-pay

## 2019-09-24 DIAGNOSIS — R5383 Other fatigue: Secondary | ICD-10-CM | POA: Diagnosis not present

## 2019-09-24 DIAGNOSIS — R12 Heartburn: Secondary | ICD-10-CM | POA: Insufficient documentation

## 2019-09-24 DIAGNOSIS — F17210 Nicotine dependence, cigarettes, uncomplicated: Secondary | ICD-10-CM | POA: Diagnosis not present

## 2019-09-24 DIAGNOSIS — Z79899 Other long term (current) drug therapy: Secondary | ICD-10-CM | POA: Insufficient documentation

## 2019-09-24 HISTORY — DX: Anxiety disorder, unspecified: F41.9

## 2019-09-24 MED ORDER — ALUM & MAG HYDROXIDE-SIMETH 200-200-20 MG/5ML PO SUSP
30.0000 mL | Freq: Once | ORAL | Status: AC
  Administered 2019-09-24: 17:00:00 30 mL via ORAL
  Filled 2019-09-24: qty 30

## 2019-09-24 MED ORDER — FAMOTIDINE 20 MG PO TABS: 20.0000 mg | ORAL_TABLET | Freq: Two times a day (BID) | ORAL | 0 refills | Status: DC

## 2019-09-24 NOTE — ED Provider Notes (Addendum)
[facility] EMERGENCY DEPARTMENT Provider Note   CSN: [id number] Arrival date & time: 09/24/19  1318     History Chief Complaint  Patient presents with  . Panic Attack    Thomas Wolfe is a 24 y.o. male with PMH significant for anxiety and panic disorder presents to the ED with complaints of fatigue subsequent to panic attack that occurred 3 days ago.  Patient reports that he takes Escitalopram daily, but did not take it 3 days ago which he believes contributed to his panic attack.  He has been prescribed hydroxyzine as an abortive agent for when he experiences these attacks and took two which made him fatigued.  He reports feeling ongoing fatigue the subsequent 2 days, but feels "95% better" today.  He also reports indigestion and heartburn symptoms these past several days and is curious if there is anything that we can do to assist.  He denied any recent illness, fevers or chills, headache or dizziness, chest pain or difficulty breathing, cough, abdominal pain, nausea or vomiting, or changes in bowel habits.  Per triage notes, diarrhea was noted, but he only told me that he "has yellow stools when he drinks with his friends".  Denies any melena or loose stools on my exam.   HPI     Past Medical History:  Diagnosis Date  . ADHD (attention deficit hyperactivity disorder)   . Anxiety     Patient Active Problem List   Diagnosis Date Noted  . Attention deficit hyperactivity disorder (ADHD) 04/27/2016    History reviewed. No pertinent surgical history.     Family History  Problem Relation Age of Onset  . Diabetes Mother     Social History   Tobacco Use  . Smoking status: Current Some Day Smoker    Types: Cigarettes  . Smokeless tobacco: Never Used  Substance Use Topics  . Alcohol use: Yes  . Drug use: No    Home Medications Prior to Admission medications   Medication Sig Start Date End Date Taking? Authorizing Provider  escitalopram (LEXAPRO) 10 MG tablet Take  10 mg by mouth daily.  05/14/19 08/12/19  [provider]  famotidine (PEPCID) 20 MG tablet Take 1 tablet (20 mg total) by mouth 2 (two) times daily. 09/24/19   Lorelee New, PA-C  hydrOXYzine (ATARAX/VISTARIL) 25 MG tablet Take 25 mg by mouth 3 (three) times daily as needed for anxiety.  05/14/19   [provider]  lisinopril (ZESTRIL) 10 MG tablet Take 10 mg by mouth daily.  05/14/19   [provider]  Multiple Vitamin (MULTIVITAMIN WITH MINERALS) TABS tablet Take 1 tablet by mouth daily.    [provider]    Allergies    Amoxicillin  Review of Systems   Review of Systems  Constitutional: Positive for fatigue. Negative for fever.  Gastrointestinal: Negative for abdominal pain and diarrhea.  Neurological: Negative for weakness and numbness.    Physical Exam Updated Vital Signs BP 113/84 (BP Location: Right Arm)   Pulse 68   Temp 98.3 F (36.8 C) (Oral)   Resp 16   Ht 6\' 1"  (1.854 m)   Wt 102.5 kg   SpO2 98%   BMI 29.82 kg/m   Physical Exam Vitals and nursing note reviewed. Exam conducted with a chaperone present.  Constitutional:      General: He is not in acute distress.    Appearance: Normal appearance. He is not ill-appearing.  HENT:     Head: Normocephalic and atraumatic.  Mouth/Throat:     Comments: Patent oropharynx.  Nonerythematous.  No tonsillar hypertrophy or exudates noted.  No tongue swelling or floor of the mouth induration.  No masses appreciated.  Tolerating secretions fine. Eyes:     General: No scleral icterus.    Conjunctiva/sclera: Conjunctivae normal.  Cardiovascular:     Rate and Rhythm: Normal rate and regular rhythm.     Pulses: Normal pulses.     Heart sounds: Normal heart sounds.  Pulmonary:     Effort: Pulmonary effort is normal. No respiratory distress.     Breath sounds: Normal breath sounds.  Abdominal:     General: Abdomen is flat. There is no distension.     Palpations: Abdomen is soft.      Tenderness: There is no abdominal tenderness. There is no guarding.  Musculoskeletal:        General: Normal range of motion.     Cervical back: Normal range of motion and neck supple. No rigidity.  Skin:    General: Skin is dry.     Capillary Refill: Capillary refill takes less than 2 seconds.  Neurological:     General: No focal deficit present.     Mental Status: He is alert and oriented to person, place, and time.     GCS: GCS eye subscore is 4. GCS verbal subscore is 5. GCS motor subscore is 6.     Cranial Nerves: No cranial nerve deficit.     Sensory: No sensory deficit.     Motor: No weakness.     Coordination: Coordination normal.     Gait: Gait normal.  Psychiatric:        Mood and Affect: Mood normal.        Behavior: Behavior normal.        Thought Content: Thought content normal.      ED Results / Procedures / Treatments   Labs (all labs ordered are listed, but only abnormal results are displayed) Labs Reviewed - No data to display  EKG None  Radiology No results found.  Procedures Procedures (including critical care time)  Medications Ordered in ED Medications  alum & mag hydroxide-simeth (MAALOX/MYLANTA) 200-200-20 MG/5ML suspension 30 mL (30 mLs Oral Given 09/24/19 1654)    ED Course  I have reviewed the triage vital signs and the nursing notes.  Pertinent labs & imaging results that were available during my care of the patient were reviewed by me and considered in my medical decision making (see chart for details).    MDM Rules/Calculators/A&P                      Patient presents to the ED reporting 2-day history of fatigue, but states that his symptoms have almost entirely resolved and he is denying any complaints on my exam.  He is simply concerned that he felt so fatigued after taking his hydroxyzine.  I informed him that hydroxyzine can make people feel sleepy.  I have low suspicion for any acute abnormalities at this time.  He denies any melena  or hematochezia.  He reports that he has been eating and drinking regularly and without any difficulty.  Low suspicion for anemia or electrolyte abnormalities at this time.  Do not feel as though any work-up is required at this time.  Provided patient with Maalox which relieved his symptoms of indigestion.  Will have patient follow-up with his primary care provider for ongoing evaluation and management of his health and wellbeing.  After my exam was concluded, patient informed the nurse that he would like STI testing.  Given that he is in here for an unrelated issue and did not report any symptoms of STIs, will instead refer him to [facility] for free STI screening. Patient tells me that he is not actually symptomatic but was recently treated with Flagyl for trichomoniasis and wanted to ensure that it was clear.   Patient is hemodynamically stable and neurovascular intact.  He is in no acute distress on my physical exam and is denying any and all complaints aside from fatigue after administration of hydroxyzine, which he states has effectively resolved.  Patient is safe for discharge at this time with outpatient follow-up with his primary care provider, [facility].  Final Clinical Impression(s) / ED Diagnoses Final diagnoses:  Fatigue, unspecified type  Heartburn    Rx / DC Orders ED Discharge Orders         Ordered    famotidine (PEPCID) 20 MG tablet  2 times daily     09/24/19 1706           Corena Herter, PA-C 09/24/19 1706    Corena Herter, PA-C 09/24/19 1736    Noemi Chapel, MD 09/25/19 [phone]

## 2019-09-24 NOTE — Discharge Instructions (Addendum)
Please take your medication regularly, as prescribed.  You may also take Tums as an abortive agent should he develop continued heartburn despite use of Pepcid.  Please read the attachment on heartburn.  Your fatigue following administration of hydroxyzine is a common adverse effect, however it is peculiar that it lasted for 2 days.  Fortunately you are feeling improved.  Please follow-up with your primary care provider for ongoing evaluation and management of your anxiety and panic disorder.  Please return to the ED or seek medical attention for any new or worsening symptoms.  Please follow-up with [facility] department for free STI screening, should that be a concern.

## 2019-09-24 NOTE — ED Triage Notes (Signed)
Patient reports having a panic attack on Sunday, c/o fatigue since. Also c/o indigestion. No emesis but reports nausea and diarrhea. Patient states he always has diarrhea due to liver problems.

## 2019-11-14 ENCOUNTER — Emergency Department (HOSPITAL_COMMUNITY)
Admission: EM | Admit: 2019-11-14 | Discharge: 2019-11-14 | Disposition: A | Discharge status: Home / Self Care | Payer: BC Managed Care – PPO | Attending: Emergency Medicine | Admitting: Emergency Medicine

## 2019-11-14 ENCOUNTER — Encounter (HOSPITAL_COMMUNITY): Payer: Self-pay

## 2019-11-14 ENCOUNTER — Other Ambulatory Visit: Payer: Self-pay

## 2019-11-14 DIAGNOSIS — F17210 Nicotine dependence, cigarettes, uncomplicated: Secondary | ICD-10-CM | POA: Insufficient documentation

## 2019-11-14 DIAGNOSIS — Z79899 Other long term (current) drug therapy: Secondary | ICD-10-CM | POA: Insufficient documentation

## 2019-11-14 DIAGNOSIS — R300 Dysuria: Secondary | ICD-10-CM | POA: Diagnosis present

## 2019-11-14 HISTORY — DX: Gastro-esophageal reflux disease without esophagitis: K21.9

## 2019-11-14 LAB — URINALYSIS, ROUTINE W REFLEX MICROSCOPIC
Bilirubin Urine: NEGATIVE
Glucose, UA: NEGATIVE mg/dL
Hgb urine dipstick: NEGATIVE
Ketones, ur: NEGATIVE mg/dL
Leukocytes,Ua: NEGATIVE
Nitrite: NEGATIVE
Protein, ur: NEGATIVE mg/dL
Specific Gravity, Urine: 1.013 (ref 1.005–1.030)
pH: 9 — ABNORMAL HIGH (ref 5.0–8.0)

## 2019-11-14 LAB — HIV ANTIBODY (ROUTINE TESTING W REFLEX): HIV Screen 4th Generation wRfx: NONREACTIVE

## 2019-11-14 MED ORDER — CEFTRIAXONE SODIUM 500 MG IJ SOLR
500.0000 mg | Freq: Once | INTRAMUSCULAR | Status: AC
  Administered 2019-11-14: 500 mg via INTRAMUSCULAR
  Filled 2019-11-14: qty 500

## 2019-11-14 MED ORDER — LIDOCAINE HCL (PF) 1 % IJ SOLN
2.0000 mL | Freq: Once | INTRAMUSCULAR | Status: AC
  Administered 2019-11-14: 2 mL
  Filled 2019-11-14: qty 2

## 2019-11-14 MED ORDER — DOXYCYCLINE HYCLATE 100 MG PO CAPS: 100.0000 mg | ORAL_CAPSULE | Freq: Two times a day (BID) | ORAL | 0 refills | Status: DC

## 2019-11-14 NOTE — Discharge Instructions (Addendum)
You were seen in the ER today due to burning with urination & concern for possible STDs.  You were tested for gonorrhea, chlamydia, HIV, & syphilis.  We will call you if results are positive- if positive you will need to inform all sexual partners. We have treated you for gonorrhea/chlamydia in the ED prophylactically with antibiotics here and a prescription for doxycycline to treat these possible diagnoses.  We have prescribed you new medication(s) today. Discuss the medications prescribed today with your pharmacist as they can have adverse effects and interactions with your other medicines including over the counter and prescribed medications. Seek medical evaluation if you start to experience new or abnormal symptoms after taking one of these medicines, seek care immediately if you start to experience difficulty breathing, feeling of your throat closing, facial swelling, or rash as these could be indications of a more serious allergic reaction  Do not participate in intercourse of any kind for at least 1 week to prevent re-infection/spread of infection.  Please follow up with the health department or your primary care provider within 1 week.  Return to the ER for new or worsening symptoms including but not limited to fever, abdominal pain, testicular pain/swelling, pain with bowel movements, or any other concerns.

## 2019-11-14 NOTE — ED Triage Notes (Signed)
Pt reports that he has burning with urination for 2 weeks. Denies discharge from penis. Has unprotected sex. Reports nausea as well

## 2019-11-14 NOTE — ED Provider Notes (Signed)
[facility] EMERGENCY DEPARTMENT Provider Note   CSN: [id number] Arrival date & time: 11/14/19  1347     History Chief Complaint  Patient presents with  . Dysuria    Thomas Wolfe is a 24 y.o. male with a hx of tobacco abuse, ADHD, anxiety, & GERD who presents to the emergency department with complaints of dysuria x 1 month. Patient states dysuria is intermittent, no specific alleviating/aggravating factors. No intervention PTA. Sometimes his stomach is upset, no pain currently. Denies fever, chills, nausea, vomiting, diarrhea, melena, hematochezia, testicular pain/swelling, or penile discharge. Currently sexually active in a monogamous relationship with 1 male partner without protection. He states because it burns when he urinates he wanted to be tested for STDs.   HPI     Past Medical History:  Diagnosis Date  . ADHD (attention deficit hyperactivity disorder)   . Anxiety   . GERD (gastroesophageal reflux disease)     Patient Active Problem List   Diagnosis Date Noted  . Attention deficit hyperactivity disorder (ADHD) 04/27/2016    History reviewed. No pertinent surgical history.     Family History  Problem Relation Age of Onset  . Diabetes Mother     Social History   Tobacco Use  . Smoking status: Current Some Day Smoker    Packs/day: 0.50    Types: Cigarettes  . Smokeless tobacco: Never Used  Substance Use Topics  . Alcohol use: Never  . Drug use: No    Home Medications Prior to Admission medications   Medication Sig Start Date End Date Taking? Authorizing Provider  escitalopram (LEXAPRO) 10 MG tablet Take 10 mg by mouth daily.  05/14/19 08/12/19  [provider]  famotidine (PEPCID) 20 MG tablet Take 1 tablet (20 mg total) by mouth 2 (two) times daily. 09/24/19   Lorelee New, PA-C  hydrOXYzine (ATARAX/VISTARIL) 25 MG tablet Take 25 mg by mouth 3 (three) times daily as needed for anxiety.  05/14/19   [provider]  lisinopril  (ZESTRIL) 10 MG tablet Take 10 mg by mouth daily.  05/14/19   [provider]  Multiple Vitamin (MULTIVITAMIN WITH MINERALS) TABS tablet Take 1 tablet by mouth daily.    [provider]    Allergies    Amoxicillin  Review of Systems   Review of Systems  Constitutional: Negative for chills and fever.  Respiratory: Negative for shortness of breath.   Cardiovascular: Negative for chest pain.  Gastrointestinal: Positive for abdominal pain (intermittent upset stomach no pain @ present). Negative for blood in stool, constipation, diarrhea, nausea and vomiting.  Genitourinary: Positive for dysuria. Negative for discharge, genital sores, hematuria, penile pain, penile swelling, scrotal swelling, testicular pain and urgency.    Physical Exam Updated Vital Signs BP 129/79 (BP Location: Right Arm)   Pulse 81   Temp 98.2 F (36.8 C) (Oral)   Resp 16   SpO2 98%   Physical Exam Vitals and nursing note reviewed. Exam conducted with a chaperone present.  Constitutional:      General: He is not in acute distress.    Appearance: He is well-developed. He is not toxic-appearing.  HENT:     Head: Normocephalic and atraumatic.  Eyes:     General:        Right eye: No discharge.        Left eye: No discharge.     Conjunctiva/sclera: Conjunctivae normal.  Cardiovascular:     Rate and Rhythm: Normal rate and regular rhythm.  Pulmonary:  Effort: Pulmonary effort is normal. No respiratory distress.     Breath sounds: Normal breath sounds. No wheezing, rhonchi or rales.  Abdominal:     General: There is no distension.     Palpations: Abdomen is soft.     Tenderness: There is no abdominal tenderness. There is no right CVA tenderness, left CVA tenderness, guarding or rebound.  Genitourinary:    Penis: Uncircumcised. No phimosis, paraphimosis or lesions.      Testes:        Right: Mass or tenderness not present.        Left: Mass or tenderness not present.     Epididymis:      Right: No tenderness.     Left: No tenderness.  Musculoskeletal:     Cervical back: Neck supple.  Skin:    General: Skin is warm and dry.     Findings: No rash.  Neurological:     Mental Status: He is alert.     Comments: Clear speech.   Psychiatric:        Behavior: Behavior normal.     ED Results / Procedures / Treatments   Labs (all labs ordered are listed, but only abnormal results are displayed) Labs Reviewed  URINALYSIS, ROUTINE W REFLEX MICROSCOPIC - Abnormal; Notable for the following components:      Result Value   APPearance HAZY (*)    pH 9.0 (*)    All other components within normal limits  GC/CHLAMYDIA PROBE AMP (English) NOT AT [facility]    EKG None  Radiology No results found.  Procedures Procedures (including critical care time)  Medications Ordered in ED Medications  cefTRIAXone (ROCEPHIN) injection 500 mg (has no administration in time range)    ED Course  I have reviewed the triage vital signs and the nursing notes.  Pertinent labs & imaging results that were available during my care of the patient were reviewed by me and considered in my medical decision making (see chart for details).    MDM Rules/Calculators/A&P                      Patient presents to the ED with complaints of dysuria intermittently x 1 month Nontoxic appearing, vitals WNL. Benign exam. No H&P components to raise concern for epididymitis, orchitis, or prostatitis. Abdomen nontender without peritoneal signs. UA without infection- culture sent. UA also without trich. Cultures for GC, chlamydia, HIV, and syphilis sent- patient aware of pending results and need to inform all sexual partner is positive. Prophylaxis for GC/chlamydia with Rocephin & doxycycline administered as patient elected for this when discussing options. Amoxicillin allergy is rash, will proceed with cephalosporin in the ED. Discussed safe sexual practices and need for protection. Health department vs. PCP follow  up. I discussed results, treatment plan, need for follow-up, and return precautions with the patient. Provided opportunity for questions, patient confirmed understanding and is in agreement with plan.   Final Clinical Impression(s) / ED Diagnoses Final diagnoses:  Dysuria    Rx / DC Orders ED Discharge Orders         Ordered    doxycycline (VIBRAMYCIN) 100 MG capsule  2 times daily     11/14/19 1703           Daltyn Degroat, [place], PA-C 11/14/19 1705    Truddie Hidden, MD 11/14/19 [phone]

## 2019-11-15 LAB — RPR: RPR Ser Ql: NONREACTIVE

## 2019-11-19 LAB — GC/CHLAMYDIA PROBE AMP (~~LOC~~) NOT AT ARMC
Chlamydia: NEGATIVE
Neisseria Gonorrhea: NEGATIVE

## 2020-08-04 ENCOUNTER — Other Ambulatory Visit: Payer: Self-pay

## 2020-08-04 ENCOUNTER — Encounter (HOSPITAL_COMMUNITY): Payer: Self-pay

## 2020-08-04 ENCOUNTER — Emergency Department (HOSPITAL_COMMUNITY)
Admission: EM | Admit: 2020-08-04 | Discharge: 2020-08-04 | Disposition: A | Discharge status: Home / Self Care | Payer: BC Managed Care – PPO | Attending: Emergency Medicine | Admitting: Emergency Medicine

## 2020-08-04 DIAGNOSIS — F17210 Nicotine dependence, cigarettes, uncomplicated: Secondary | ICD-10-CM | POA: Insufficient documentation

## 2020-08-04 DIAGNOSIS — R369 Urethral discharge, unspecified: Secondary | ICD-10-CM | POA: Diagnosis not present

## 2020-08-04 DIAGNOSIS — Z79899 Other long term (current) drug therapy: Secondary | ICD-10-CM | POA: Insufficient documentation

## 2020-08-04 LAB — HIV ANTIBODY (ROUTINE TESTING W REFLEX): HIV Screen 4th Generation wRfx: NONREACTIVE

## 2020-08-04 NOTE — ED Triage Notes (Signed)
Pt requesting STD testing due to penile drainage.

## 2020-08-04 NOTE — ED Provider Notes (Signed)
[facility] EMERGENCY DEPARTMENT Provider Note   CSN: [id number] Arrival date & time: 08/04/20  0944     History Chief Complaint  Patient presents with  . Penile Discharge    Thomas Wolfe is a 24 y.o. male.  Patient states he had a penile discharge for over a month.  He has been seen once before at an urgent care and his tests were negative.  He wanted to be tested for STDs.  Patient does not complain of any pain with his discharge  The history is provided by the patient and medical records. No language interpreter was used.  Penile Discharge This is a recurrent problem. The current episode started more than 1 week ago. The problem occurs constantly. The problem has not changed since onset.Pertinent negatives include no chest pain, no abdominal pain and no headaches. Nothing aggravates the symptoms. Nothing relieves the symptoms. He has tried nothing for the symptoms.       Past Medical History:  Diagnosis Date  . ADHD (attention deficit hyperactivity disorder)   . Anxiety   . GERD (gastroesophageal reflux disease)     Patient Active Problem List   Diagnosis Date Noted  . Attention deficit hyperactivity disorder (ADHD) 04/27/2016    History reviewed. No pertinent surgical history.     Family History  Problem Relation Age of Onset  . Diabetes Mother     Social History   Tobacco Use  . Smoking status: Current Some Day Smoker    Packs/day: 0.50    Types: Cigarettes  . Smokeless tobacco: Never Used  Vaping Use  . Vaping Use: Never assessed  Substance Use Topics  . Alcohol use: Not Currently  . Drug use: No    Home Medications Prior to Admission medications   Medication Sig Start Date End Date Taking? Authorizing Provider  doxycycline (VIBRAMYCIN) 100 MG capsule Take 1 capsule (100 mg total) by mouth 2 (two) times daily. 11/14/19   Petrucelli, Samantha R, PA-C  escitalopram (LEXAPRO) 10 MG tablet Take 10 mg by mouth daily.  05/14/19 08/12/19  [provider]  famotidine (PEPCID) 20 MG tablet Take 1 tablet (20 mg total) by mouth 2 (two) times daily. 09/24/19   Lorelee New, PA-C  hydrOXYzine (ATARAX/VISTARIL) 25 MG tablet Take 25 mg by mouth 3 (three) times daily as needed for anxiety.  05/14/19   [provider]  lisinopril (ZESTRIL) 10 MG tablet Take 10 mg by mouth daily.  05/14/19   [provider]  Multiple Vitamin (MULTIVITAMIN WITH MINERALS) TABS tablet Take 1 tablet by mouth daily.    [provider]    Allergies    Amoxicillin  Review of Systems   Review of Systems  Constitutional: Negative for appetite change and fatigue.  HENT: Negative for congestion, ear discharge and sinus pressure.   Eyes: Negative for discharge.  Respiratory: Negative for cough.   Cardiovascular: Negative for chest pain.  Gastrointestinal: Negative for abdominal pain and diarrhea.  Genitourinary: Positive for discharge. Negative for frequency and hematuria.  Musculoskeletal: Negative for back pain.  Skin: Negative for rash.  Neurological: Negative for seizures and headaches.  Psychiatric/Behavioral: Negative for hallucinations.    Physical Exam Updated Vital Signs BP 119/76 (BP Location: Left Arm)   Pulse 92   Temp 98.6 F (37 C) (Oral)   Resp (!) 21   Ht 6\' 1"  (1.854 m)   Wt 121.6 kg   SpO2 100%   BMI 35.36 kg/m   Physical Exam Vitals  reviewed.  Constitutional:      Appearance: He is well-developed.  HENT:     Head: Normocephalic.     Nose: Nose normal.  Eyes:     General: No scleral icterus.    Conjunctiva/sclera: Conjunctivae normal.  Neck:     Thyroid: No thyromegaly.  Cardiovascular:     Rate and Rhythm: Normal rate and regular rhythm.     Heart sounds: No murmur heard.  No friction rub. No gallop.   Pulmonary:     Breath sounds: No stridor. No wheezing or rales.  Chest:     Chest wall: No tenderness.  Abdominal:     General: There is no distension.     Tenderness: There is no  abdominal tenderness. There is no rebound.  Genitourinary:    Comments: No discharge same Musculoskeletal:        General: Normal range of motion.     Cervical back: Neck supple.  Lymphadenopathy:     Cervical: No cervical adenopathy.  Skin:    Findings: No erythema or rash.  Neurological:     Mental Status: He is alert and oriented to person, place, and time.     Motor: No abnormal muscle tone.     Coordination: Coordination normal.  Psychiatric:        Behavior: Behavior normal.     ED Results / Procedures / Treatments   Labs (all labs ordered are listed, but only abnormal results are displayed) Labs Reviewed  RPR  HIV ANTIBODY (ROUTINE TESTING W REFLEX)  GC/CHLAMYDIA PROBE AMP ([place]) NOT AT [facility]    EKG None  Radiology No results found.  Procedures Procedures (including critical care time)  Medications Ordered in ED Medications - No data to display  ED Course  I have reviewed the triage vital signs and the nursing notes.  Pertinent labs & imaging results that were available during my care of the patient were reviewed by me and considered in my medical decision making (see chart for details).    MDM Rules/Calculators/A&P                          Patient with history of penile discharge.  No discharge seen on exam.  STD test sent and patient will follow up with PCP Final Clinical Impression(s) / ED Diagnoses Final diagnoses:  Penile discharge    Rx / DC Orders ED Discharge Orders    None       Bethann Berkshire, MD 08/10/20 [phone]

## 2020-08-04 NOTE — Discharge Instructions (Addendum)
Follow-up with your family doctor in 1 to 2 weeks for results

## 2020-08-05 LAB — RPR: RPR Ser Ql: NONREACTIVE

## 2020-08-06 LAB — GC/CHLAMYDIA PROBE AMP (~~LOC~~) NOT AT ARMC
Chlamydia: NEGATIVE
Comment: NEGATIVE
Comment: NORMAL
Neisseria Gonorrhea: NEGATIVE

## 2020-12-16 IMAGING — DX DG CHEST 2V
2 series · 2 of 2 positions shown · non-contrast
Comparison: None.

CLINICAL DATA: Chest pain

EXAM:
CHEST - 2 VIEW

[chest pa]
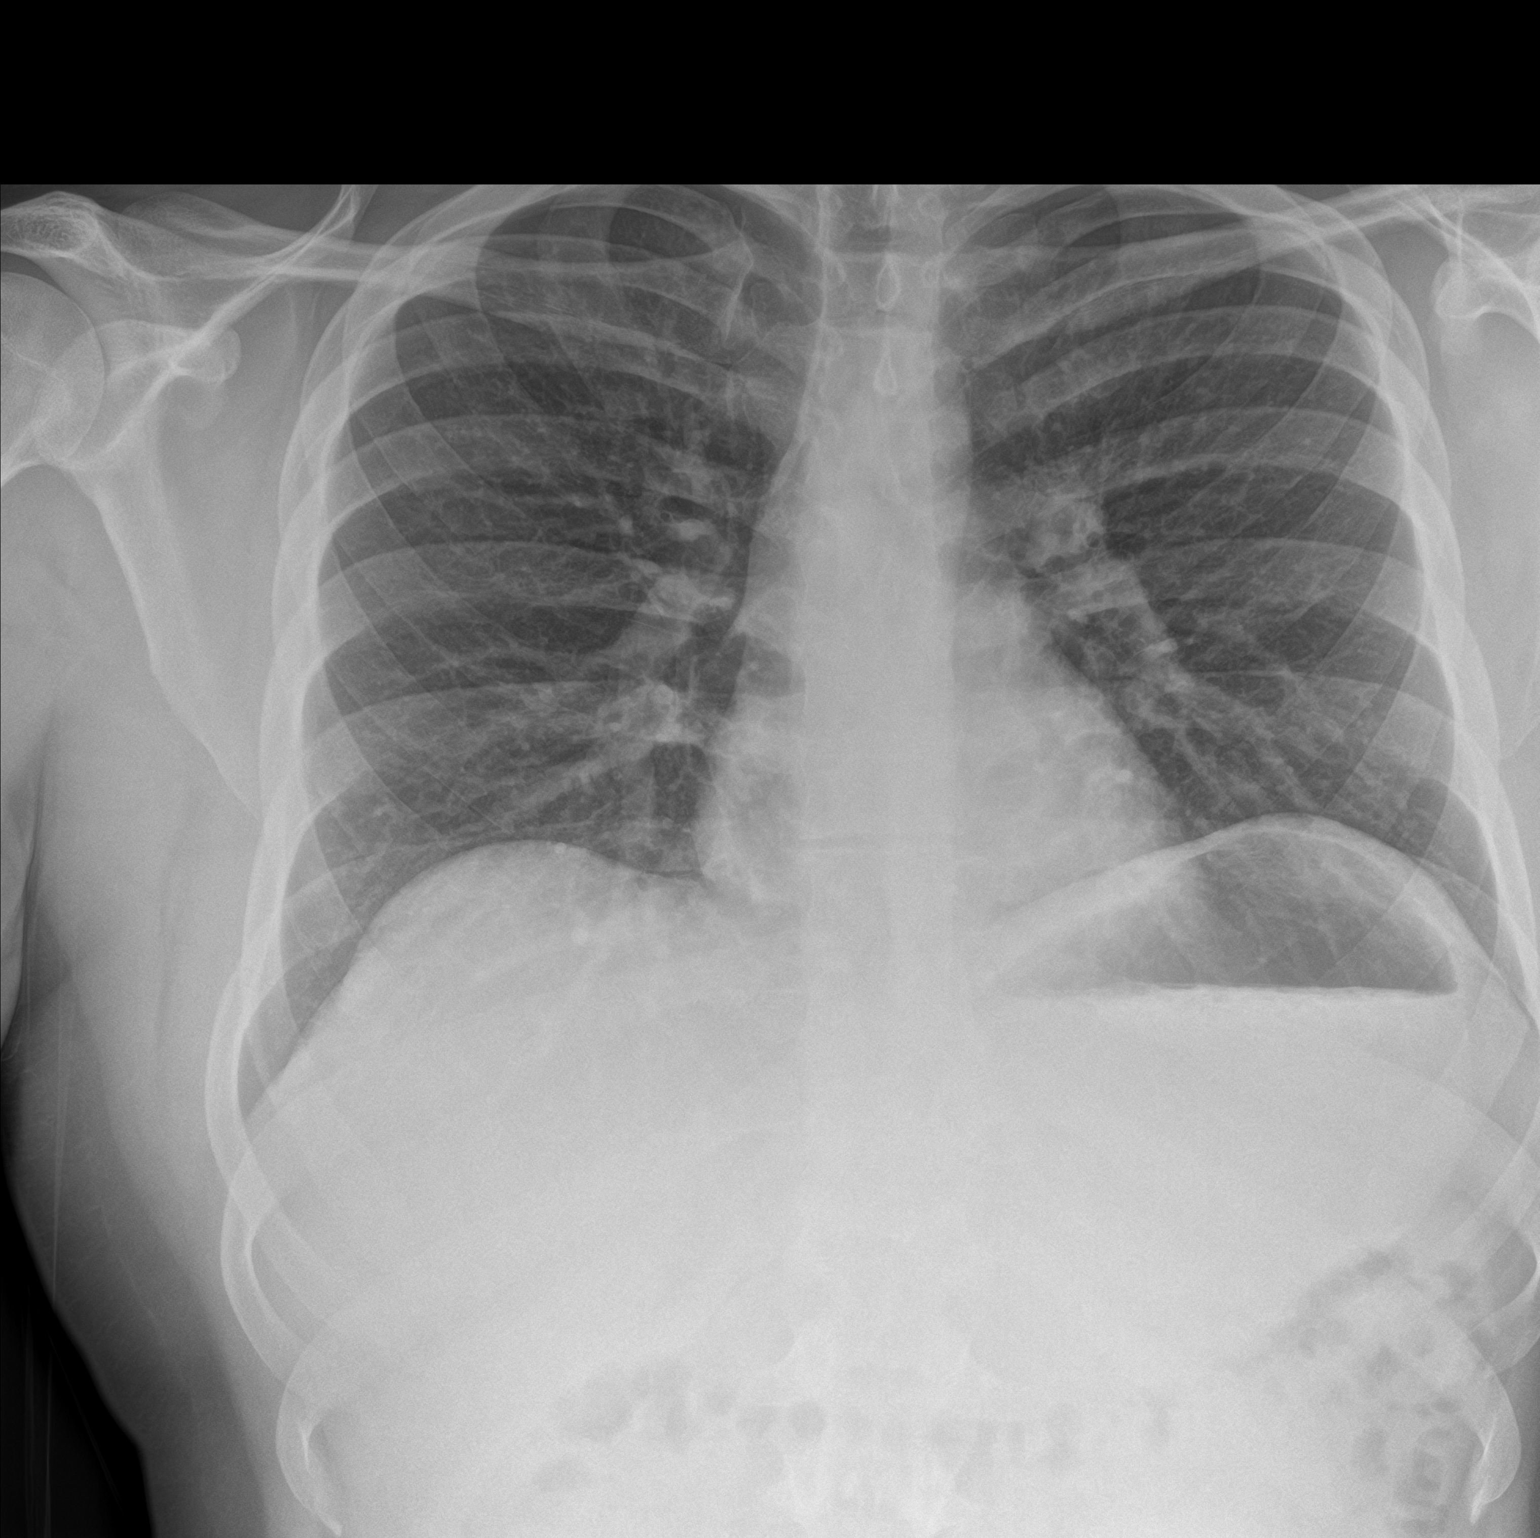

[chest lat]
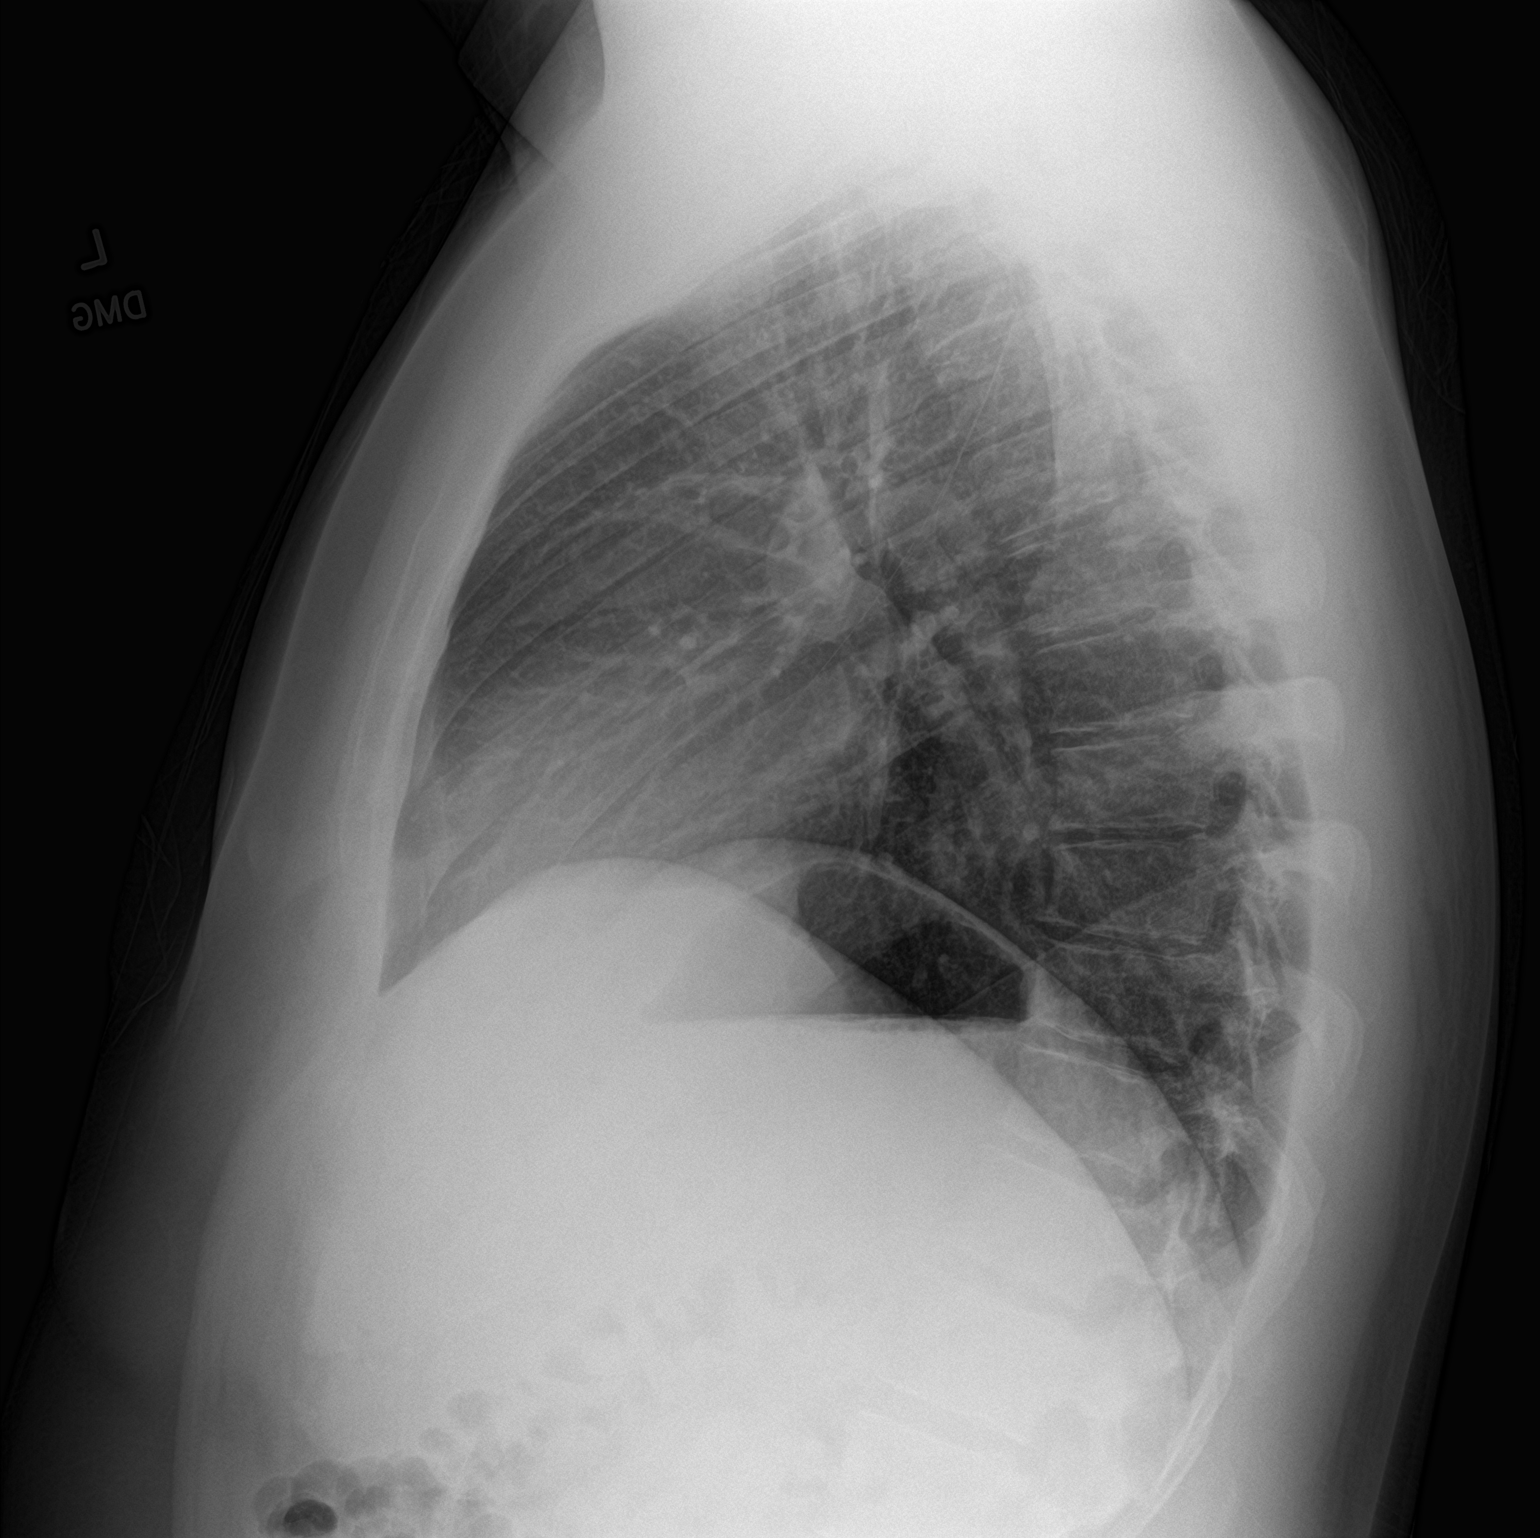

[2 of 2 positions shown; findings below may reference images not displayed]

FINDINGS: The heart size and mediastinal contours are within normal limits.
Both lungs are clear. The visualized skeletal structures are
unremarkable.
IMPRESSION: No active cardiopulmonary disease.

## 2020-12-23 IMAGING — CT CT HEAD W/O CM
3 series · 15 of 47 positions shown, 18 images · non-contrast
Comparison: None.

CLINICAL DATA: Headache

EXAM:
CT HEAD WITHOUT CONTRAST
TECHNIQUE: Contiguous axial images were obtained from the base of the skull
through the vertex without intravenous contrast.

[Series 2: head wo · axial · 0.46mm/px · z∈[-136,-6]mm · 9 of 32 slices shown, 12 images]
[im 3/32  brain]
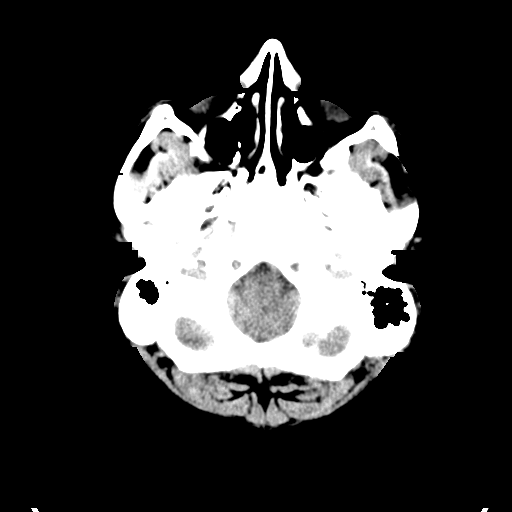
[im 3/32  bone]
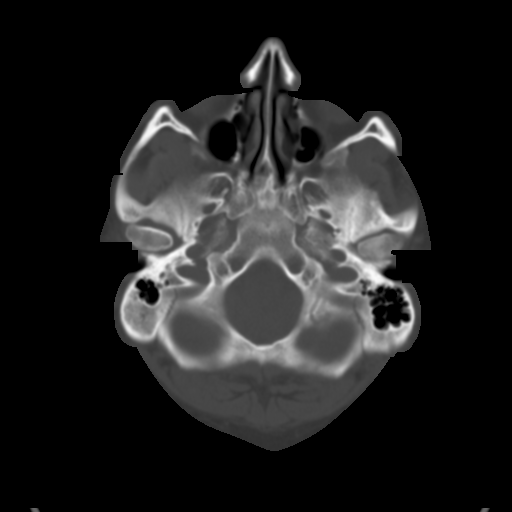
[im 6/32  brain]
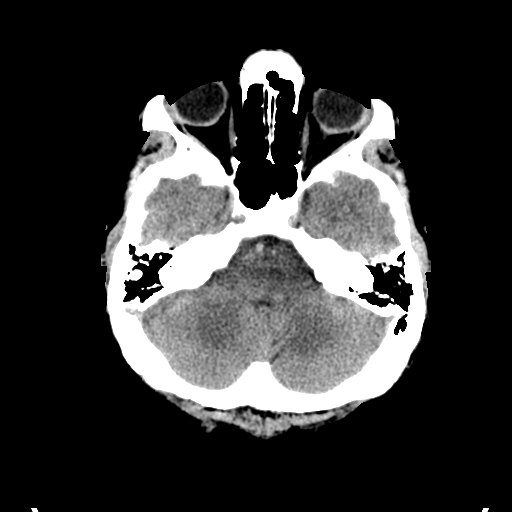
[im 9/32  brain]
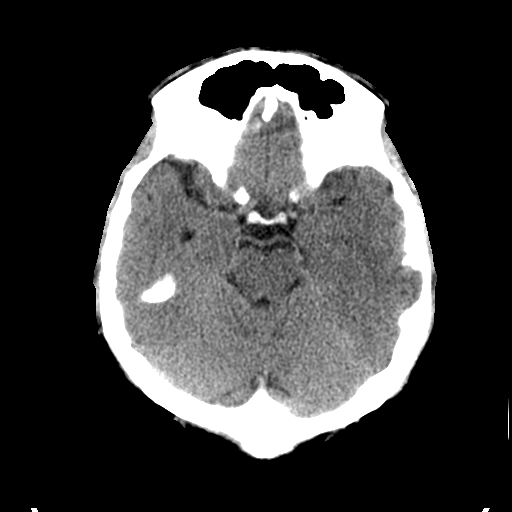
[im 12/32  brain]
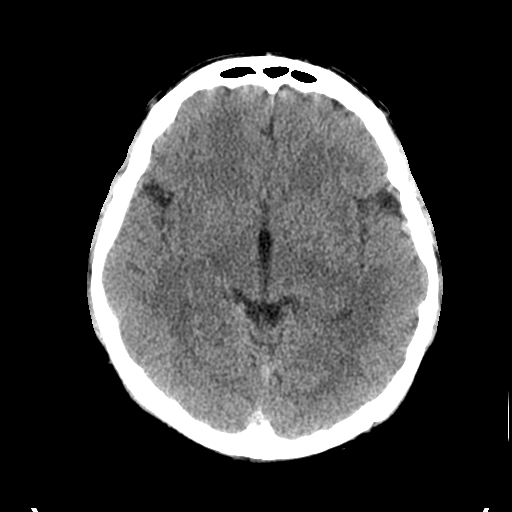
[im 17/32  brain]
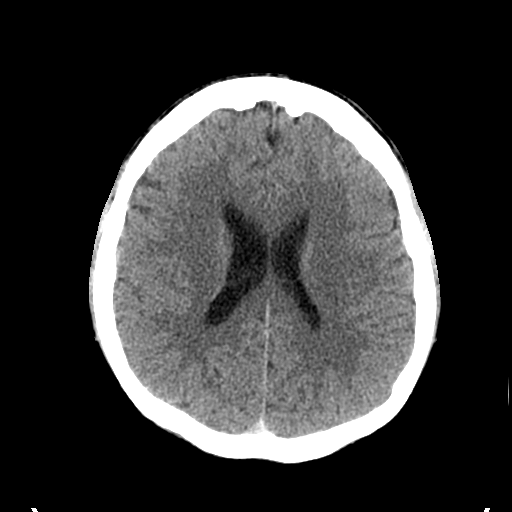
[im 17/32  bone]
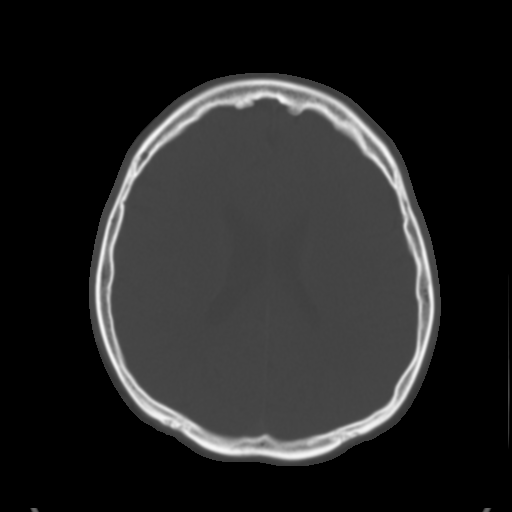
[im 20/32  brain]
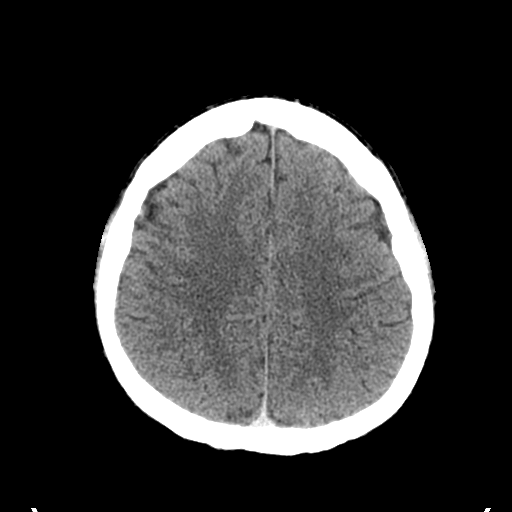
[im 23/32  brain]
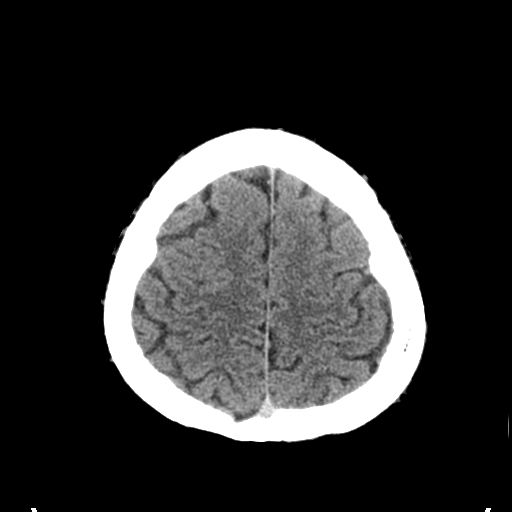
[im 26/32  brain]
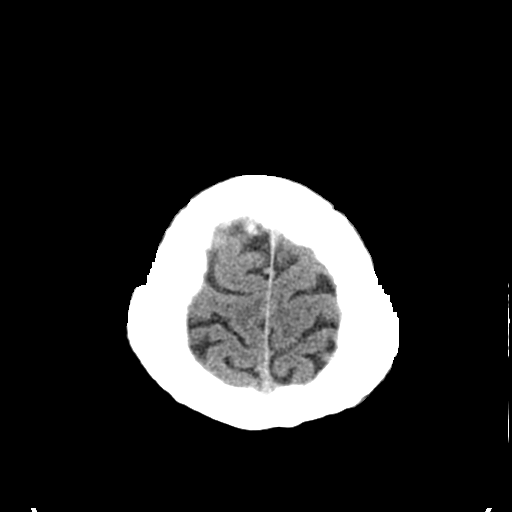
[im 29/32  brain]
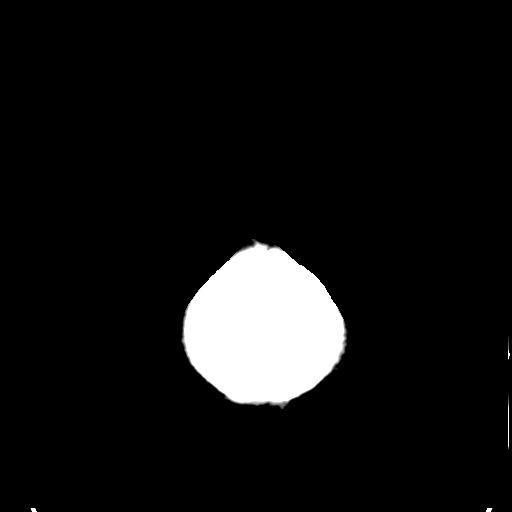
[im 29/32  bone]
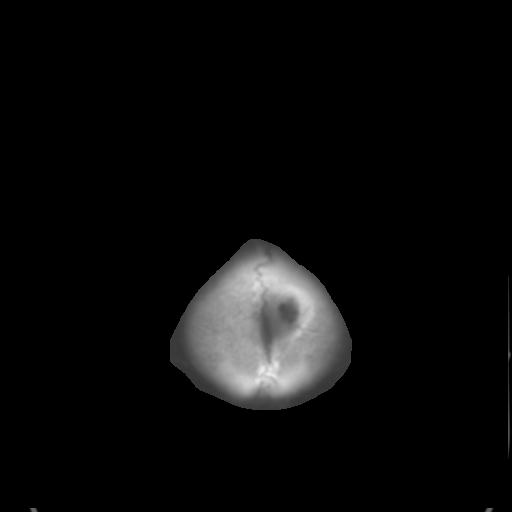

[Series 5: coronal soft tissue · coronal · 0.31mm/px · 3 of 73 slices shown]
[im 25/73  brain]
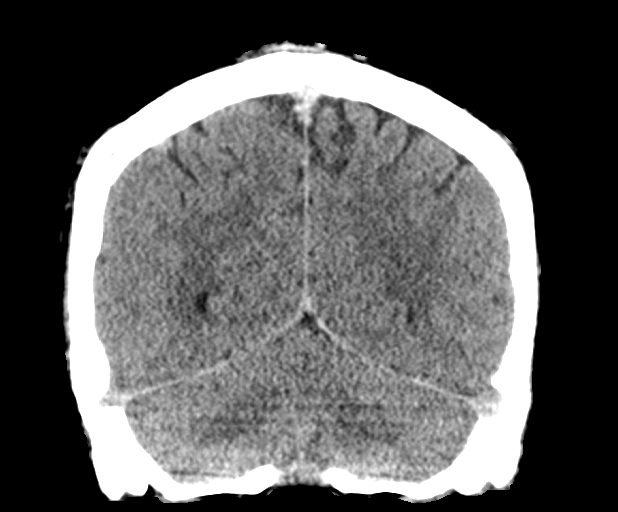
[im 33/73  brain]
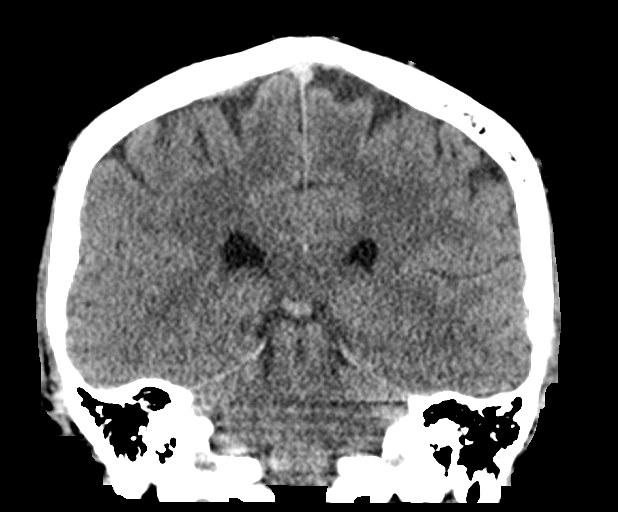
[im 41/73  brain]
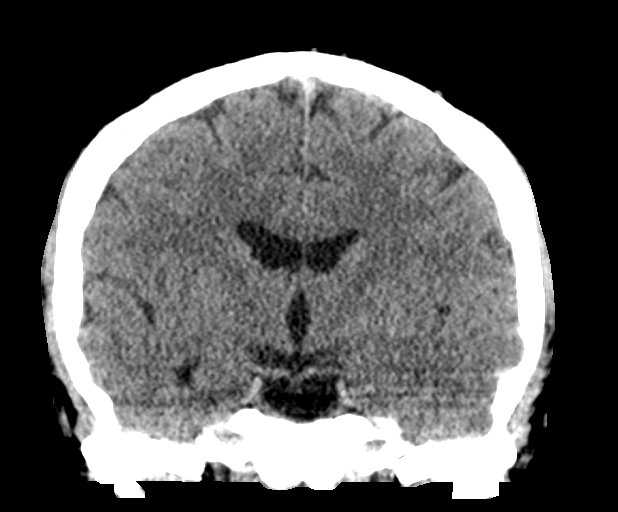

[Series 6: sagittal soft tissue · sagittal · 0.31mm/px · 3 of 66 slices shown]
[im 22/66  brain]
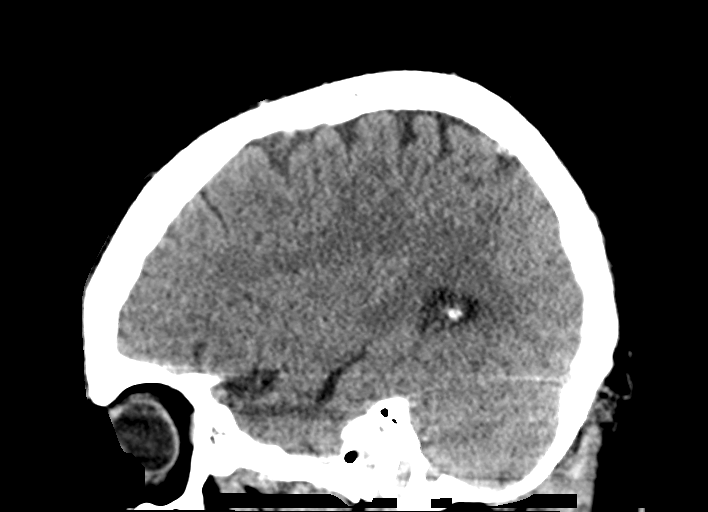
[im 33/66  brain]
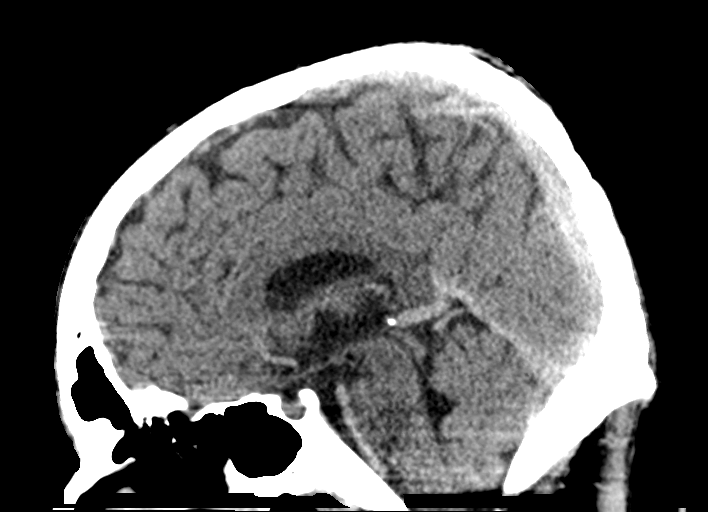
[im 44/66  brain]
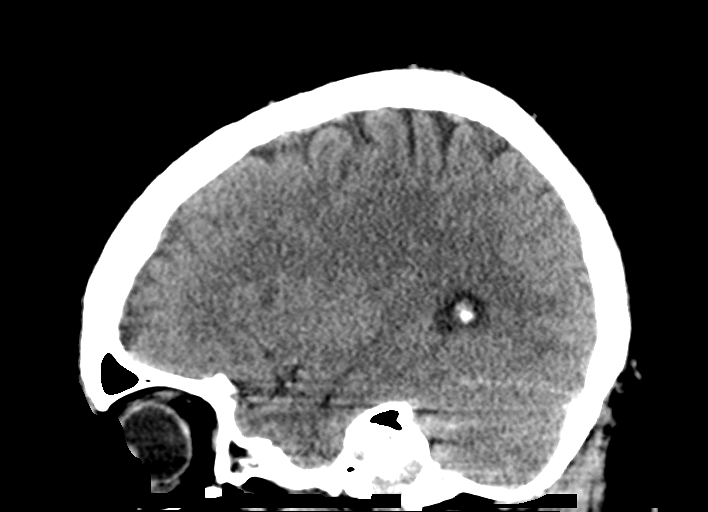

[15 of 47 positions shown; findings below may reference images not displayed]

FINDINGS: Brain: No acute intracranial abnormality. Specifically, no
hemorrhage, hydrocephalus, mass lesion, acute infarction, or
significant intracranial injury.

Vascular: No hyperdense vessel or unexpected calcification.

Skull: No acute calvarial abnormality.

Sinuses/Orbits: Visualized paranasal sinuses and mastoids clear.
Orbital soft tissues unremarkable.

Other: None
IMPRESSION: Normal study.

## 2022-05-18 ENCOUNTER — Encounter (HOSPITAL_COMMUNITY): Payer: Self-pay | Admitting: *Deleted

## 2022-05-18 ENCOUNTER — Emergency Department (HOSPITAL_COMMUNITY)
Admission: EM | Admit: 2022-05-18 | Discharge: 2022-05-18 | Disposition: A | Discharge status: Home / Self Care | Payer: BC Managed Care – PPO | Attending: Emergency Medicine | Admitting: Emergency Medicine

## 2022-05-18 ENCOUNTER — Other Ambulatory Visit: Payer: Self-pay

## 2022-05-18 DIAGNOSIS — R109 Unspecified abdominal pain: Secondary | ICD-10-CM | POA: Insufficient documentation

## 2022-05-18 DIAGNOSIS — R110 Nausea: Secondary | ICD-10-CM | POA: Insufficient documentation

## 2022-05-18 DIAGNOSIS — R197 Diarrhea, unspecified: Secondary | ICD-10-CM

## 2022-05-18 DIAGNOSIS — E860 Dehydration: Secondary | ICD-10-CM | POA: Insufficient documentation

## 2022-05-18 LAB — COMPREHENSIVE METABOLIC PANEL
ALT: 25 U/L (ref 0–44)
AST: 19 U/L (ref 15–41)
Albumin: 4.8 g/dL (ref 3.5–5.0)
Alkaline Phosphatase: 41 U/L (ref 38–126)
Anion gap: 8 (ref 5–15)
BUN: 11 mg/dL (ref 6–20)
CO2: 28 mmol/L (ref 22–32)
Calcium: 9.3 mg/dL (ref 8.9–10.3)
Chloride: 100 mmol/L (ref 98–111)
Creatinine, Ser: 0.81 mg/dL (ref 0.61–1.24)
GFR, Estimated: >60 mL/min (ref >60)
Glucose, Bld: 94 mg/dL (ref 70–99)
Potassium: 3.6 mmol/L (ref 3.5–5.1)
Sodium: 136 mmol/L (ref 135–145)
Total Bilirubin: 3.5 mg/dL — ABNORMAL HIGH (ref 0.3–1.2)
Total Protein: 8 g/dL (ref 6.5–8.1)

## 2022-05-18 LAB — URINALYSIS, ROUTINE W REFLEX MICROSCOPIC
Bilirubin Urine: NEGATIVE
Glucose, UA: NEGATIVE mg/dL
Hgb urine dipstick: NEGATIVE
Ketones, ur: 5 mg/dL — AB
Leukocytes,Ua: NEGATIVE
Nitrite: NEGATIVE
Protein, ur: NEGATIVE mg/dL
Specific Gravity, Urine: 1.009 (ref 1.005–1.030)
pH: 8 (ref 5.0–8.0)

## 2022-05-18 LAB — CBC WITH DIFFERENTIAL/PLATELET
Abs Immature Granulocytes: 0 10*3/uL (ref 0.00–0.07)
Basophils Absolute: 0 10*3/uL (ref 0.0–0.1)
Basophils Relative: 1 %
Eosinophils Absolute: 0 10*3/uL (ref 0.0–0.5)
Eosinophils Relative: 1 %
HCT: 49.3 % (ref 39.0–52.0)
Hemoglobin: 17.7 g/dL — ABNORMAL HIGH (ref 13.0–17.0)
Immature Granulocytes: 0 %
Lymphocytes Relative: 41 %
Lymphs Abs: 1.6 10*3/uL (ref 0.7–4.0)
MCH: 31.1 pg (ref 26.0–34.0)
MCHC: 35.9 g/dL (ref 30.0–36.0)
MCV: 86.6 fL (ref 80.0–100.0)
Monocytes Absolute: 0.3 10*3/uL (ref 0.1–1.0)
Monocytes Relative: 8 %
Neutro Abs: 2 10*3/uL (ref 1.7–7.7)
Neutrophils Relative %: 49 %
Platelets: 238 10*3/uL (ref 150–400)
RBC: 5.69 MIL/uL (ref 4.22–5.81)
RDW: 11.9 % (ref 11.5–15.5)
WBC: 3.9 10*3/uL — ABNORMAL LOW (ref 4.0–10.5)
nRBC: 0 % (ref 0.0–0.2)

## 2022-05-18 LAB — LIPASE, BLOOD: Lipase: 50 U/L (ref 11–51)

## 2022-05-18 MED ORDER — SODIUM CHLORIDE 0.9 % IV BOLUS
1000.0000 mL | Freq: Once | INTRAVENOUS | Status: AC
  Administered 2022-05-18: 1000 mL via INTRAVENOUS

## 2022-05-18 MED ORDER — LOPERAMIDE HCL 2 MG PO CAPS: 2.0000 mg | ORAL_CAPSULE | Freq: Four times a day (QID) | ORAL | 0 refills | Status: DC | PRN

## 2022-05-18 MED ORDER — LOPERAMIDE HCL 2 MG PO CAPS: 2.0000 mg | ORAL_CAPSULE | Freq: Four times a day (QID) | ORAL | 0 refills | Status: AC | PRN

## 2022-05-18 NOTE — ED Notes (Signed)
Pt alert, NAD, calm, interactive, resps e/u, speaking clearly, labs sent, urinal given. Denies vomiting, bleeding, fever. Denies h/o similar, or h/o diverticulosis/itis.

## 2022-05-18 NOTE — ED Provider Notes (Signed)
[facility] EMERGENCY DEPARTMENT Provider Note   CSN: [id number] Arrival date & time: 05/18/22  4332     History Chief Complaint  Patient presents with   Diarrhea    Thomas Wolfe is a 26 y.o. male patient who presents to the emergency department today for further evaluation of diarrhea for the last 2 weeks.  Patient states he has a minimum of 4-5 loose watery bowel movements per day.  Patient was initially seen evaluated urgent care in the [facility] network and had a stool culture.  I am unable to see those results on Care Everywhere.  Patient has been drinking some lemon water but does feel that he might be dehydrated.  Does report associated nausea but no vomiting.  No fever or chills.  Some abdominal cramping when he has the bowel movements but no other symptoms.   Diarrhea      Home Medications Prior to Admission medications   Medication Sig Start Date End Date Taking? Authorizing Provider  omeprazole (PRILOSEC OTC) 20 MG tablet Take 20 mg by mouth daily as needed (reflux).   Yes [provider]      Allergies    Amoxicillin    Review of Systems   Review of Systems  Gastrointestinal:  Positive for diarrhea.  All other systems reviewed and are negative.   Physical Exam Updated Vital Signs BP 124/77   Pulse 63   Temp 98.4 F (36.9 C) (Oral)   Resp 17   Ht 6\' 1"  (1.854 m)   Wt 111.1 kg   SpO2 100%   BMI 32.32 kg/m  Physical Exam Vitals and nursing note reviewed.  Constitutional:      General: He is not in acute distress.    Appearance: Normal appearance.  HENT:     Head: Normocephalic and atraumatic.     Mouth/Throat:     Mouth: Mucous membranes are dry.  Eyes:     General:        Right eye: No discharge.        Left eye: No discharge.  Cardiovascular:     Comments: Regular rate and rhythm.  S1/S2 are distinct without any evidence of murmur, rubs, or gallops.  Radial pulses are 2+ bilaterally.  Dorsalis pedis pulses are 2+  bilaterally.  No evidence of pedal edema. Pulmonary:     Comments: Clear to auscultation bilaterally.  Normal effort.  No respiratory distress.  No evidence of wheezes, rales, or rhonchi heard throughout. Abdominal:     General: Abdomen is flat. Bowel sounds are normal. There is no distension.     Tenderness: There is no abdominal tenderness. There is no guarding or rebound.  Musculoskeletal:        General: Normal range of motion.     Cervical back: Neck supple.  Skin:    General: Skin is warm and dry.     Findings: No rash.  Neurological:     General: No focal deficit present.     Mental Status: He is alert.  Psychiatric:        Mood and Affect: Mood normal.        Behavior: Behavior normal.     ED Results / Procedures / Treatments   Labs (all labs ordered are listed, but only abnormal results are displayed) Labs Reviewed  CBC WITH DIFFERENTIAL/PLATELET - Abnormal; Notable for the following components:      Result Value   WBC 3.9 (*)    Hemoglobin 17.7 (*)  All other components within normal limits  COMPREHENSIVE METABOLIC PANEL - Abnormal; Notable for the following components:   Total Bilirubin 3.5 (*)    All other components within normal limits  URINALYSIS, ROUTINE W REFLEX MICROSCOPIC - Abnormal; Notable for the following components:   Ketones, ur 5 (*)    All other components within normal limits  LIPASE, BLOOD    EKG None  Radiology No results found.  Procedures Procedures    Medications Ordered in ED Medications  sodium chloride 0.9 % bolus 1,000 mL (0 mLs Intravenous Stopped 05/18/22 1207)    ED Course/ Medical Decision Making/ A&P Clinical Course as of 05/18/22 1437  Thu May 18, 2022  1435 CBC with Differential(!) Mild leukopenia. [CF]  1436 Comprehensive metabolic panel(!) Hyperbilirubinemia.  This seems to be a trend for the patient and is slightly worse than normal.  No elevated LFTs. [CF]  1436 Lipase, blood Normal. [CF]  1436 Urinalysis,  Routine w reflex microscopic Urine, Clean Catch(!) Normal. [CF]    Clinical Course User Index [CF] Teressa Lower, PA-C                           Medical Decision Making Thomas Wolfe is a 26 y.o. male patient who presents to the emergency department today for further evaluation of subacute diarrhea.  Patient has been having large-volume watery stools.  I will give him some fluids and check some labs.  Stool culture has been obtained at an outside facility.  I am unable to see those results.  We will likely defer antibiotic treatment until those results return.  I will plan to reassess once some of the labs result.  On reevaluation, patient is doing better after some fluids.  I was unable to look at the stool culture results as highlighted above.  Labs are all reassuring today.  I will prescribe him some Imodium and have him follow-up with his primary care doctor for further evaluation.  Strict return precautions were discussed.  He is safe for discharge.   Amount and/or Complexity of Data Reviewed Labs: ordered. Decision-making details documented in ED Course.    Final Clinical Impression(s) / ED Diagnoses Final diagnoses:  Diarrhea, unspecified type  Dehydration    Rx / DC Orders ED Discharge Orders     None         Teressa Lower, [place] 05/18/22 1438    Terrilee Files, MD 05/18/22 [phone]

## 2022-05-18 NOTE — ED Triage Notes (Signed)
Pt states he has been having diarrhea x 2 weeks with some nausea and stomach pain; pt describes the diarrhea as watery and yellow at times

## 2022-05-18 NOTE — Discharge Instructions (Signed)
Please drink plenty of fluids and get plenty of rest.  Follow-up with your primary care doctor for further evaluation.  I Minna prescribe the Imodium which should help with the diarrhea.  He may also eat rice which is a stool bulking agent.  Return to the emergency department for any worsening symptoms you might have.
